# Patient Record
Sex: Male | Born: 1935 | Race: White | Hispanic: No | Smoking: Never smoker
Health system: Southern US, Community
[De-identification: ages and names within clinical notes are randomized; demographics above are authoritative.]

## PROBLEM LIST (undated history)

## (undated) DIAGNOSIS — E119 Type 2 diabetes mellitus without complications: Secondary | ICD-10-CM

## (undated) DIAGNOSIS — E079 Disorder of thyroid, unspecified: Secondary | ICD-10-CM

## (undated) DIAGNOSIS — M9712XA 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, initial encounter: Secondary | ICD-10-CM

## (undated) DIAGNOSIS — S72141A Displaced intertrochanteric fracture of right femur, initial encounter for closed fracture: Secondary | ICD-10-CM

## (undated) DIAGNOSIS — F329 Major depressive disorder, single episode, unspecified: Secondary | ICD-10-CM

## (undated) DIAGNOSIS — K449 Diaphragmatic hernia without obstruction or gangrene: Secondary | ICD-10-CM

## (undated) DIAGNOSIS — I10 Essential (primary) hypertension: Secondary | ICD-10-CM

## (undated) DIAGNOSIS — F32A Depression, unspecified: Secondary | ICD-10-CM

## (undated) HISTORY — PX: REPLACEMENT TOTAL KNEE: SUR1224

## (undated) HISTORY — PX: HERNIA REPAIR: SHX51

---

## 2015-11-19 ENCOUNTER — Encounter (HOSPITAL_COMMUNITY): Payer: Self-pay

## 2015-11-19 ENCOUNTER — Emergency Department (HOSPITAL_COMMUNITY)
Admission: EM | Admit: 2015-11-19 | Discharge: 2015-11-19 | Disposition: A | Discharge status: Home / Self Care | Payer: Medicare Other | Attending: Emergency Medicine | Admitting: Emergency Medicine

## 2015-11-19 ENCOUNTER — Emergency Department (HOSPITAL_COMMUNITY): Payer: Medicare Other

## 2015-11-19 DIAGNOSIS — R197 Diarrhea, unspecified: Secondary | ICD-10-CM | POA: Diagnosis present

## 2015-11-19 DIAGNOSIS — F329 Major depressive disorder, single episode, unspecified: Secondary | ICD-10-CM | POA: Insufficient documentation

## 2015-11-19 DIAGNOSIS — R1010 Upper abdominal pain, unspecified: Secondary | ICD-10-CM | POA: Insufficient documentation

## 2015-11-19 DIAGNOSIS — I10 Essential (primary) hypertension: Secondary | ICD-10-CM | POA: Insufficient documentation

## 2015-11-19 DIAGNOSIS — Z96659 Presence of unspecified artificial knee joint: Secondary | ICD-10-CM | POA: Diagnosis not present

## 2015-11-19 DIAGNOSIS — E119 Type 2 diabetes mellitus without complications: Secondary | ICD-10-CM | POA: Diagnosis not present

## 2015-11-19 DIAGNOSIS — Z7984 Long term (current) use of oral hypoglycemic drugs: Secondary | ICD-10-CM | POA: Diagnosis not present

## 2015-11-19 HISTORY — DX: Diaphragmatic hernia without obstruction or gangrene: K44.9

## 2015-11-19 HISTORY — DX: Essential (primary) hypertension: I10

## 2015-11-19 HISTORY — DX: Depression, unspecified: F32.A

## 2015-11-19 HISTORY — DX: Major depressive disorder, single episode, unspecified: F32.9

## 2015-11-19 HISTORY — DX: Disorder of thyroid, unspecified: E07.9

## 2015-11-19 HISTORY — DX: Type 2 diabetes mellitus without complications: E11.9

## 2015-11-19 LAB — COMPREHENSIVE METABOLIC PANEL
ALT: 40 U/L (ref 17–63)
ANION GAP: 7 (ref 5–15)
AST: 29 U/L (ref 15–41)
Albumin: 4.3 g/dL (ref 3.5–5.0)
Alkaline Phosphatase: 63 U/L (ref 38–126)
BUN: 13 mg/dL (ref 6–20)
CHLORIDE: 105 mmol/L (ref 101–111)
CO2: 26 mmol/L (ref 22–32)
Calcium: 9.9 mg/dL (ref 8.9–10.3)
Creatinine, Ser: 1.04 mg/dL (ref 0.61–1.24)
GFR calc non Af Amer: >60 mL/min (ref >60)
GLUCOSE: 90 mg/dL (ref 65–99)
POTASSIUM: 3.2 mmol/L — AB (ref 3.5–5.1)
SODIUM: 138 mmol/L (ref 135–145)
TOTAL PROTEIN: 7.4 g/dL (ref 6.5–8.1)
Total Bilirubin: 1 mg/dL (ref 0.3–1.2)

## 2015-11-19 LAB — LIPASE, BLOOD: LIPASE: 21 U/L (ref 11–51)

## 2015-11-19 LAB — CBC
HEMATOCRIT: 46.8 % (ref 39.0–52.0)
HEMOGLOBIN: 16.3 g/dL (ref 13.0–17.0)
MCH: 31.3 pg (ref 26.0–34.0)
MCHC: 34.8 g/dL (ref 30.0–36.0)
MCV: 89.8 fL (ref 78.0–100.0)
Platelets: 231 10*3/uL (ref 150–400)
RBC: 5.21 MIL/uL (ref 4.22–5.81)
RDW: 13.2 % (ref 11.5–15.5)
WBC: 8.8 10*3/uL (ref 4.0–10.5)

## 2015-11-19 LAB — URINALYSIS, ROUTINE W REFLEX MICROSCOPIC
BILIRUBIN URINE: NEGATIVE
Glucose, UA: NEGATIVE mg/dL
Hgb urine dipstick: NEGATIVE
Ketones, ur: NEGATIVE mg/dL
Leukocytes, UA: NEGATIVE
NITRITE: NEGATIVE
PH: 6.5 (ref 5.0–8.0)
Protein, ur: NEGATIVE mg/dL
SPECIFIC GRAVITY, URINE: 1.02 (ref 1.005–1.030)

## 2015-11-19 MED ORDER — IOPAMIDOL (ISOVUE-300) INJECTION 61%
100.0000 mL | Freq: Once | INTRAVENOUS | Status: AC | PRN
  Administered 2015-11-19: 100 mL via INTRAVENOUS

## 2015-11-19 MED ORDER — SODIUM CHLORIDE 0.9 % IV BOLUS (SEPSIS)
1000.0000 mL | Freq: Once | INTRAVENOUS | Status: AC
  Administered 2015-11-19: 1000 mL via INTRAVENOUS

## 2015-11-19 MED ORDER — ONDANSETRON HCL 4 MG/2ML IJ SOLN
4.0000 mg | Freq: Once | INTRAMUSCULAR | Status: AC
  Administered 2015-11-19: 4 mg via INTRAVENOUS
  Filled 2015-11-19: qty 2

## 2015-11-19 MED ORDER — POTASSIUM CHLORIDE CRYS ER 20 MEQ PO TBCR
40.0000 meq | EXTENDED_RELEASE_TABLET | Freq: Once | ORAL | Status: AC
  Administered 2015-11-19: 40 meq via ORAL
  Filled 2015-11-19: qty 2

## 2015-11-19 NOTE — Discharge Instructions (Signed)
Return here as needed.  Follow-up with your primary care doctor     Food Choices to Help Relieve Diarrhea, Adult When you have diarrhea, the foods you eat and your eating habits are very important. Choosing the right foods and drinks can help relieve diarrhea. Also, because diarrhea can last up to 7 days, you need to replace lost fluids and electrolytes (such as sodium, potassium, and chloride) in order to help prevent dehydration.  WHAT GENERAL GUIDELINES DO I NEED TO FOLLOW?  Slowly drink 1 cup (8 oz) of fluid for each episode of diarrhea. If you are getting enough fluid, your urine will be clear or pale yellow.  Eat starchy foods. Some good choices include white rice, white toast, pasta, low-fiber cereal, baked potatoes (without the skin), saltine crackers, and bagels.  Avoid large servings of any cooked vegetables.  Limit fruit to two servings per day. A serving is  cup or 1 small piece.  Choose foods with less than 2 g of fiber per serving.  Limit fats to less than 8 tsp (38 g) per day.  Avoid fried foods.  Eat foods that have probiotics in them. Probiotics can be found in certain dairy products.  Avoid foods and beverages that may increase the speed at which food moves through the stomach and intestines (gastrointestinal tract). Things to avoid include:  High-fiber foods, such as dried fruit, raw fruits and vegetables, nuts, seeds, and whole grain foods.  Spicy foods and high-fat foods.  Foods and beverages sweetened with high-fructose corn syrup, honey, or sugar alcohols such as xylitol, sorbitol, and mannitol. WHAT FOODS ARE RECOMMENDED? Grains White rice. White, [place], or pita breads (fresh or toasted), including plain rolls, buns, or bagels. White pasta. Saltine, soda, or graham crackers. Pretzels. Low-fiber cereal. Cooked cereals made with water (such as cornmeal, farina, or cream cereals). Plain muffins. Matzo. Melba toast. Zwieback.  Vegetables Potatoes (without the  skin). Strained tomato and vegetable juices. Most well-cooked and canned vegetables without seeds. Tender lettuce. Fruits Cooked or canned applesauce, apricots, cherries, fruit cocktail, grapefruit, peaches, pears, or plums. Fresh bananas, apples without skin, cherries, grapes, cantaloupe, grapefruit, peaches, oranges, or plums.  Meat and Other Protein Products Baked or boiled chicken. Eggs. Tofu. Fish. Seafood. Smooth peanut butter. Ground or well-cooked tender beef, ham, veal, lamb, pork, or poultry.  Dairy Plain yogurt, kefir, and unsweetened liquid yogurt. Lactose-free milk, buttermilk, or soy milk. Plain hard cheese. Beverages Sport drinks. Clear broths. Diluted fruit juices (except prune). Regular, caffeine-free sodas such as ginger ale. Water. Decaffeinated teas. Oral rehydration solutions. Sugar-free beverages not sweetened with sugar alcohols. Other Bouillon, broth, or soups made from recommended foods.  The items listed above may not be a complete list of recommended foods or beverages. Contact your dietitian for more options. WHAT FOODS ARE NOT RECOMMENDED? Grains Whole grain, whole wheat, bran, or rye breads, rolls, pastas, crackers, and cereals. Wild or brown rice. Cereals that contain more than 2 g of fiber per serving. Corn tortillas or taco shells. Cooked or dry oatmeal. Granola. Popcorn. Vegetables Raw vegetables. Cabbage, broccoli, Brussels sprouts, artichokes, baked beans, beet greens, corn, kale, legumes, peas, sweet potatoes, and yams. Potato skins. Cooked spinach and cabbage. Fruits Dried fruit, including raisins and dates. Raw fruits. Stewed or dried prunes. Fresh apples with skin, apricots, mangoes, pears, raspberries, and strawberries.  Meat and Other Protein Products Chunky peanut butter. Nuts and seeds. Beans and lentils. Tomasa BlaseBacon.  Dairy High-fat cheeses. Milk, chocolate milk, and beverages made with milk, such  as milk shakes. Cream. Ice cream. Sweets and  Desserts Sweet rolls, doughnuts, and sweet breads. Pancakes and waffles. Fats and Oils Butter. Cream sauces. Margarine. Salad oils. Plain salad dressings. Olives. Avocados.  Beverages Caffeinated beverages (such as coffee, tea, soda, or energy drinks). Alcoholic beverages. Fruit juices with pulp. Prune juice. Soft drinks sweetened with high-fructose corn syrup or sugar alcohols. Other Coconut. Hot sauce. Chili powder. Mayonnaise. Gravy. Cream-based or milk-based soups.  The items listed above may not be a complete list of foods and beverages to avoid. Contact your dietitian for more information. WHAT SHOULD I DO IF I BECOME DEHYDRATED? Diarrhea can sometimes lead to dehydration. Signs of dehydration include dark urine and dry mouth and skin. If you think you are dehydrated, you should rehydrate with an oral rehydration solution. These solutions can be purchased at pharmacies, retail stores, or online.  Drink -1 cup (120-240 mL) of oral rehydration solution each time you have an episode of diarrhea. If drinking this amount makes your diarrhea worse, try drinking smaller amounts more often. For example, drink 1-3 tsp (5-15 mL) every 5-10 minutes.  A general rule for staying hydrated is to drink 1-2 L of fluid per day. Talk to your health care provider about the specific amount you should be drinking each day. Drink enough fluids to keep your urine clear or pale yellow.   This information is not intended to replace advice given to you by your health care provider. Make sure you discuss any questions you have with your health care provider.   Document Released: 08/29/2003 Document Revised: 06/29/2014 Document Reviewed: 05/01/2013 Elsevier Interactive Patient Education [employer]

## 2015-11-19 NOTE — ED Notes (Signed)
Per pt, not feeling well x 1 week. Pt also states diarrhea x 4 days.  No n/v.  "lots of gas".  Pt has hiatal hernia.  Pt is a diabetic but last check per MD was wnl.  Pt also concerned for nausea with new depression meds.  No fever.

## 2015-11-19 NOTE — ED Provider Notes (Signed)
  Face-to-face evaluation   History: He has abdominal comfort today. He has had some loose stool 2-3 times a day and the last 24-36 hours. About a week ago had constipation took prunes and then MiraLAX, 1. This treatment seemed to improve that constipation. He has been recently started on Zoloft, Xanax and trazodone. [place], [place]. No history of abdominal surgery or chronic constipation. He had a fall injuring his abdomen, about 2 weeks ago.  Physical exam: Alert, elderly man in mild distress. Abdomen soft with mild upper abdominal tenderness and guarding. There is a small flat bruise of the right lateral abdominal wall.  Medical screening examination/treatment/procedure(s) were conducted as a shared visit with non-physician practitioner(s) and myself.  I personally evaluated the patient during the encounter  Mancel BaleElliott Markell Schrier, MD 11/23/15 2144

## 2015-11-20 NOTE — ED Provider Notes (Signed)
CSN: [id number]     Arrival date & time 11/19/15  1121 History   First MD Initiated Contact with Patient 11/19/15 1204     Chief Complaint  Patient presents with  . Diarrhea     (Consider location/radiation/quality/duration/timing/severity/associated sxs/prior Treatment) HPI Patient presents to the emergency department with diarrhea for the last 5 days.  The patient states that he was having constipation prior to this and states that he took MiraLAX and then the diarrhea started.  Patient states that he has not noticed any blood in the stool.  He states that this morning he did have a more formed stool, but was still not normal.  Patient states nothing seems to make the condition better or worse.  Patient has some mild upper abdominal pain. The patient denies chest pain, shortness of breath, headache,blurred vision, neck pain, fever, cough, weakness, numbness, dizziness, anorexia, edema, nausea, vomiting, rash, back pain, dysuria, hematemesis, bloody stool, near syncope, or syncope. Past Medical History  Diagnosis Date  . Diabetes mellitus without complication (HCC)   . Hiatal hernia   . Hypertension   . Thyroid disease   . Depression    Past Surgical History  Procedure Laterality Date  . Replacement total knee    . Hernia repair     History reviewed. No pertinent family history. Social History  Substance Use Topics  . Smoking status: Never Smoker   . Smokeless tobacco: None  . Alcohol Use: No    Review of Systems  All other systems negative except as documented in the HPI. All pertinent positives and negatives as reviewed in the HPI.   Allergies  Review of patient's allergies indicates no known allergies.  Home Medications   Prior to Admission medications   Medication Sig Start Date End Date Taking? Authorizing Provider  ALPRAZolam Prudy Feeler) 0.5 MG tablet Take 0.5 mg by mouth daily as needed for anxiety.   Yes Historical Provider, MD  atorvastatin (LIPITOR) 10 MG tablet  Take 10 mg by mouth daily.   Yes Historical Provider, MD  glimepiride (AMARYL) 2 MG tablet Take 2 mg by mouth daily with breakfast.   Yes Historical Provider, MD  levothyroxine (SYNTHROID, LEVOTHROID) 125 MCG tablet Take 125 mcg by mouth daily before breakfast.   Yes Historical Provider, MD  lisinopril-hydrochlorothiazide (PRINZIDE,ZESTORETIC) 20-25 MG tablet Take 1 tablet by mouth daily.   Yes Historical Provider, MD  polyethylene glycol (MIRALAX / GLYCOLAX) packet Take 17 g by mouth daily as needed for mild constipation.   Yes Historical Provider, MD  sertraline (ZOLOFT) 50 MG tablet Take 50 mg by mouth daily.   Yes Historical Provider, MD  traZODone (DESYREL) 50 MG tablet Take 50 mg by mouth at bedtime.   Yes Historical Provider, MD   BP 105/70 mmHg  Pulse 60  Temp(Src) 98.1 F (36.7 C) (Oral)  Resp 18  SpO2 100% Physical Exam  Constitutional: He is oriented to person, place, and time. He appears well-developed and well-nourished. No distress.  HENT:  Head: Normocephalic and atraumatic.  Mouth/Throat: Oropharynx is clear and moist.  Eyes: Pupils are equal, round, and reactive to light.  Neck: Normal range of motion. Neck supple.  Cardiovascular: Normal rate, regular rhythm and normal heart sounds.  Exam reveals no gallop and no friction rub.   No murmur heard. Pulmonary/Chest: Effort normal and breath sounds normal. No respiratory distress. He has no wheezes.  Abdominal: Soft. Bowel sounds are normal. He exhibits no distension. There is tenderness. There is no rebound and no  guarding.  Neurological: He is alert and oriented to person, place, and time. He exhibits normal muscle tone. Coordination normal.  Skin: Skin is warm and dry. No rash noted. No erythema.  Psychiatric: He has a normal mood and affect. His behavior is normal.  Nursing note and vitals reviewed.   ED Course  Procedures (including critical care time) Labs Review Labs Reviewed  COMPREHENSIVE METABOLIC PANEL -  Abnormal; Notable for the following:    Potassium 3.2 (*)    All other components within normal limits  LIPASE, BLOOD  CBC  URINALYSIS, ROUTINE W REFLEX MICROSCOPIC (NOT AT [facility])    Imaging Review Ct Abdomen Pelvis W Contrast  11/19/2015  CLINICAL DATA:  82 year old with 4 day history of unexplained diarrhea. Surgical history includes unspecified hernia repair. EXAM: CT ABDOMEN AND PELVIS WITH CONTRAST TECHNIQUE: Multidetector CT imaging of the abdomen and pelvis was performed using the standard protocol following bolus administration of intravenous contrast. CONTRAST:  ISOVUE-300 IOPAMIDOL INJECTION 61% IV. Oral contrast was not administered at the request of the ordering physician. COMPARISON:  None. FINDINGS: Lower chest: Mild dependent atelectasis in the lower lobes, as expected. Densely calcified approximate 1 cm granuloma deep in the medial right lower lobe. Noncalcified subpleural nodule measuring approximately 1.3 x 0.9 cm along the right hemidiaphragm. Heart mildly enlarged. Right coronary atherosclerosis. Small hiatal hernia containing intra-abdominal fat. Hepatobiliary: Diffuse hepatic steatosis without focal hepatic parenchymal abnormality Gallbladder normal in appearance without calcified gallstones. No biliary ductal dilation. Pancreas: Normal in appearance without evidence of mass, ductal dilation, or inflammation. Spleen: Calcified granulomata within the otherwise normal-appearing spleen. Adrenals/Urinary Tract: Normal appearing adrenal glands. Approximate 2.3 x 2.2 x 2.8 cm simple cyst arising from the mid right kidney. subcentimeter simple cyst arising from the lower pole right kidney. No significant focal parenchymal abnormality involving either kidney. No urinary tract calculi or hydronephrosis on either side. Normal-appearing urinary bladder. Stomach/Bowel: Stomach decompressed and normal in appearance. Small bowel normal in caliber throughout. Moderate colonic stool burden.  Solitary diverticulum arising from the proximal sigmoid colon without evidence of acute diverticulitis. Remainder of the colon normal in appearance. Normal-appearing long appendix in the right upper pelvis extending to the midline. Vascular/Lymphatic: Severe aorto-iliofemoral atherosclerosis with a focal right common iliac artery aneurysm measuring up to approximately 1.9 cm diameter. No evidence of abdominal aortic aneurysm. Visceral arteries atherosclerotic though patent. Normal-appearing portal venous and systemic venous systems. No pathologic lymphadenopathy. Reproductive: Mild median lobe prostate gland enlargement. Normal seminal vesicles. Other: Numerous pelvic phleboliths. 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 Facet degenerative changes involving the lower lumbar spine. Degenerative disc disease at L4-5 and L5-S1. Degenerative changes involving the sacroiliac joints and hips. No acute abnormalities. IMPRESSION: 1. No acute abnormalities involving the abdomen or pelvis. 2. Solitary sigmoid colon diverticula without evidence of acute diverticulitis. 3. 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 No evidence of abdominal aortic aneurysm. 4. Diffuse hepatic steatosis without focal hepatic parenchymal abnormality. 5. Old granulomatous disease. 6. Approximate 1 cm noncalcified subpleural nodule adjacent to the right hemidiaphragm in the right lower lobe, statistically likely a subpleural lymph node. Electronically Signed   By: Hulan Saas M.D.   On: 11/19/2015 14:02   I have personally reviewed and evaluated these images and lab results as part of my medical decision-making.   EKG Interpretation None      MDM   Final diagnoses:  Diarrhea, unspecified type  The patient has a negative CT scan.  Patient will be referred back to his primary care Dr.  The patient most likely had the  [facility]  Patient states he had another bowel movement here in the emergency department that was more formed than previous.  Patient is advised to return here as needed.  Patient agrees the plan and all questions were answered    Charlestine NightChristopher Antha Niday, PA-C 11/23/15 1512  Mancel BaleElliott Wentz, MD 11/23/15 2144

## 2018-01-14 ENCOUNTER — Inpatient Hospital Stay (HOSPITAL_COMMUNITY): Payer: No Typology Code available for payment source

## 2018-01-14 ENCOUNTER — Encounter (HOSPITAL_COMMUNITY): Payer: Self-pay | Admitting: Radiology

## 2018-01-14 ENCOUNTER — Other Ambulatory Visit: Payer: Self-pay

## 2018-01-14 ENCOUNTER — Inpatient Hospital Stay (HOSPITAL_COMMUNITY): Payer: No Typology Code available for payment source | Admitting: Certified Registered"

## 2018-01-14 ENCOUNTER — Emergency Department (HOSPITAL_COMMUNITY): Payer: No Typology Code available for payment source

## 2018-01-14 ENCOUNTER — Inpatient Hospital Stay (HOSPITAL_COMMUNITY)
Admission: EM | Admit: 2018-01-14 | Discharge: 2018-01-20 | DRG: 963 | Disposition: E | Payer: No Typology Code available for payment source | Attending: General Surgery | Admitting: General Surgery

## 2018-01-14 ENCOUNTER — Encounter (HOSPITAL_COMMUNITY): Admission: EM | Disposition: E | Payer: Self-pay | Source: Home / Self Care

## 2018-01-14 DIAGNOSIS — S2243XA Multiple fractures of ribs, bilateral, initial encounter for closed fracture: Secondary | ICD-10-CM | POA: Diagnosis present

## 2018-01-14 DIAGNOSIS — S72141A Displaced intertrochanteric fracture of right femur, initial encounter for closed fracture: Secondary | ICD-10-CM | POA: Diagnosis present

## 2018-01-14 DIAGNOSIS — R34 Anuria and oliguria: Secondary | ICD-10-CM | POA: Diagnosis present

## 2018-01-14 DIAGNOSIS — T508X5A Adverse effect of diagnostic agents, initial encounter: Secondary | ICD-10-CM | POA: Diagnosis present

## 2018-01-14 DIAGNOSIS — J9602 Acute respiratory failure with hypercapnia: Secondary | ICD-10-CM | POA: Diagnosis not present

## 2018-01-14 DIAGNOSIS — Z9689 Presence of other specified functional implants: Secondary | ICD-10-CM

## 2018-01-14 DIAGNOSIS — D649 Anemia, unspecified: Secondary | ICD-10-CM | POA: Diagnosis not present

## 2018-01-14 DIAGNOSIS — M9712XA 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, initial encounter: Secondary | ICD-10-CM | POA: Diagnosis present

## 2018-01-14 DIAGNOSIS — Z96652 Presence of left artificial knee joint: Secondary | ICD-10-CM | POA: Diagnosis present

## 2018-01-14 DIAGNOSIS — R579 Shock, unspecified: Secondary | ICD-10-CM | POA: Diagnosis present

## 2018-01-14 DIAGNOSIS — Z515 Encounter for palliative care: Secondary | ICD-10-CM | POA: Diagnosis present

## 2018-01-14 DIAGNOSIS — N280 Ischemia and infarction of kidney: Secondary | ICD-10-CM | POA: Diagnosis present

## 2018-01-14 DIAGNOSIS — S060X9A Concussion with loss of consciousness of unspecified duration, initial encounter: Secondary | ICD-10-CM | POA: Diagnosis present

## 2018-01-14 DIAGNOSIS — I480 Paroxysmal atrial fibrillation: Secondary | ICD-10-CM

## 2018-01-14 DIAGNOSIS — S270XXA Traumatic pneumothorax, initial encounter: Secondary | ICD-10-CM | POA: Diagnosis present

## 2018-01-14 DIAGNOSIS — Z781 Physical restraint status: Secondary | ICD-10-CM

## 2018-01-14 DIAGNOSIS — Y92413 State road as the place of occurrence of the external cause: Secondary | ICD-10-CM

## 2018-01-14 DIAGNOSIS — T148XXA Other injury of unspecified body region, initial encounter: Secondary | ICD-10-CM

## 2018-01-14 DIAGNOSIS — D696 Thrombocytopenia, unspecified: Secondary | ICD-10-CM | POA: Diagnosis present

## 2018-01-14 DIAGNOSIS — S81012A Laceration without foreign body, left knee, initial encounter: Secondary | ICD-10-CM

## 2018-01-14 DIAGNOSIS — S2249XA Multiple fractures of ribs, unspecified side, initial encounter for closed fracture: Secondary | ICD-10-CM

## 2018-01-14 DIAGNOSIS — T17918A Gastric contents in respiratory tract, part unspecified causing other injury, initial encounter: Secondary | ICD-10-CM | POA: Diagnosis not present

## 2018-01-14 DIAGNOSIS — S24103A Unspecified injury at T7-T10 level of thoracic spinal cord, initial encounter: Secondary | ICD-10-CM | POA: Diagnosis present

## 2018-01-14 DIAGNOSIS — J9601 Acute respiratory failure with hypoxia: Secondary | ICD-10-CM | POA: Diagnosis not present

## 2018-01-14 DIAGNOSIS — D62 Acute posthemorrhagic anemia: Secondary | ICD-10-CM | POA: Diagnosis present

## 2018-01-14 DIAGNOSIS — R402253 Coma scale, best verbal response, oriented, at hospital admission: Secondary | ICD-10-CM | POA: Diagnosis present

## 2018-01-14 DIAGNOSIS — Z978 Presence of other specified devices: Secondary | ICD-10-CM

## 2018-01-14 DIAGNOSIS — E875 Hyperkalemia: Secondary | ICD-10-CM | POA: Diagnosis not present

## 2018-01-14 DIAGNOSIS — Z01818 Encounter for other preprocedural examination: Secondary | ICD-10-CM

## 2018-01-14 DIAGNOSIS — S72402A Unspecified fracture of lower end of left femur, initial encounter for closed fracture: Secondary | ICD-10-CM

## 2018-01-14 DIAGNOSIS — S22078A Other fracture of T9-T10 vertebra, initial encounter for closed fracture: Secondary | ICD-10-CM | POA: Diagnosis present

## 2018-01-14 DIAGNOSIS — R402143 Coma scale, eyes open, spontaneous, at hospital admission: Secondary | ICD-10-CM | POA: Diagnosis present

## 2018-01-14 DIAGNOSIS — R402363 Coma scale, best motor response, obeys commands, at hospital admission: Secondary | ICD-10-CM | POA: Diagnosis present

## 2018-01-14 DIAGNOSIS — I6340 Cerebral infarction due to embolism of unspecified cerebral artery: Secondary | ICD-10-CM

## 2018-01-14 DIAGNOSIS — N179 Acute kidney failure, unspecified: Secondary | ICD-10-CM | POA: Diagnosis not present

## 2018-01-14 DIAGNOSIS — I10 Essential (primary) hypertension: Secondary | ICD-10-CM | POA: Diagnosis present

## 2018-01-14 DIAGNOSIS — Z8249 Family history of ischemic heart disease and other diseases of the circulatory system: Secondary | ICD-10-CM

## 2018-01-14 DIAGNOSIS — E785 Hyperlipidemia, unspecified: Secondary | ICD-10-CM | POA: Diagnosis present

## 2018-01-14 DIAGNOSIS — E039 Hypothyroidism, unspecified: Secondary | ICD-10-CM | POA: Diagnosis present

## 2018-01-14 DIAGNOSIS — S2242XA Multiple fractures of ribs, left side, initial encounter for closed fracture: Secondary | ICD-10-CM

## 2018-01-14 DIAGNOSIS — I4891 Unspecified atrial fibrillation: Secondary | ICD-10-CM | POA: Diagnosis not present

## 2018-01-14 DIAGNOSIS — N170 Acute kidney failure with tubular necrosis: Secondary | ICD-10-CM | POA: Diagnosis present

## 2018-01-14 DIAGNOSIS — J939 Pneumothorax, unspecified: Secondary | ICD-10-CM

## 2018-01-14 DIAGNOSIS — Z79899 Other long term (current) drug therapy: Secondary | ICD-10-CM

## 2018-01-14 DIAGNOSIS — R0902 Hypoxemia: Secondary | ICD-10-CM

## 2018-01-14 DIAGNOSIS — E1165 Type 2 diabetes mellitus with hyperglycemia: Secondary | ICD-10-CM | POA: Diagnosis present

## 2018-01-14 DIAGNOSIS — I63423 Cerebral infarction due to embolism of bilateral anterior cerebral arteries: Secondary | ICD-10-CM | POA: Diagnosis not present

## 2018-01-14 DIAGNOSIS — Z452 Encounter for adjustment and management of vascular access device: Secondary | ICD-10-CM

## 2018-01-14 DIAGNOSIS — T85598A Other mechanical complication of other gastrointestinal prosthetic devices, implants and grafts, initial encounter: Secondary | ICD-10-CM

## 2018-01-14 DIAGNOSIS — S22009A Unspecified fracture of unspecified thoracic vertebra, initial encounter for closed fracture: Secondary | ICD-10-CM

## 2018-01-14 DIAGNOSIS — Z79890 Hormone replacement therapy (postmenopausal): Secondary | ICD-10-CM

## 2018-01-14 DIAGNOSIS — Z7984 Long term (current) use of oral hypoglycemic drugs: Secondary | ICD-10-CM

## 2018-01-14 DIAGNOSIS — S72001A Fracture of unspecified part of neck of right femur, initial encounter for closed fracture: Secondary | ICD-10-CM

## 2018-01-14 DIAGNOSIS — S22068A Other fracture of T7-T8 thoracic vertebra, initial encounter for closed fracture: Secondary | ICD-10-CM | POA: Diagnosis present

## 2018-01-14 HISTORY — DX: Displaced intertrochanteric fracture of right femur, initial encounter for closed fracture: S72.141A

## 2018-01-14 HISTORY — DX: 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, initial encounter: M97.12XA

## 2018-01-14 LAB — PREPARE FRESH FROZEN PLASMA
Unit division: 0
Unit division: 0

## 2018-01-14 LAB — CBC
HEMATOCRIT: 45.3 % (ref 39.0–52.0)
HEMATOCRIT: 35.4 % — AB (ref 39.0–52.0)
HEMOGLOBIN: 14.2 g/dL (ref 13.0–17.0)
HEMOGLOBIN: 11.3 g/dL — AB (ref 13.0–17.0)
MCH: 29.7 pg (ref 26.0–34.0)
MCH: 29.9 pg (ref 26.0–34.0)
MCHC: 31.3 g/dL (ref 30.0–36.0)
MCHC: 31.9 g/dL (ref 30.0–36.0)
MCV: 94.8 fL (ref 78.0–100.0)
MCV: 93.7 fL (ref 78.0–100.0)
Platelets: 239 10*3/uL (ref 150–400)
Platelets: 143 10*3/uL — ABNORMAL LOW (ref 150–400)
RBC: 4.78 MIL/uL (ref 4.22–5.81)
RBC: 3.78 MIL/uL — ABNORMAL LOW (ref 4.22–5.81)
RDW: 13.6 % (ref 11.5–15.5)
RDW: 14.1 % (ref 11.5–15.5)
WBC: 12 10*3/uL — AB (ref 4.0–10.5)
WBC: 15.6 10*3/uL — ABNORMAL HIGH (ref 4.0–10.5)

## 2018-01-14 LAB — URINALYSIS, ROUTINE W REFLEX MICROSCOPIC
Bilirubin Urine: NEGATIVE
Glucose, UA: NEGATIVE mg/dL
Ketones, ur: NEGATIVE mg/dL
Leukocytes, UA: NEGATIVE
NITRITE: NEGATIVE
PROTEIN: NEGATIVE mg/dL
Specific Gravity, Urine: 1.041 — ABNORMAL HIGH (ref 1.005–1.030)
pH: 6 (ref 5.0–8.0)

## 2018-01-14 LAB — BPAM FFP
Blood Product Expiration Date: 201908102359
Blood Product Expiration Date: 201908162359
ISSUE DATE / TIME: 201907261501
ISSUE DATE / TIME: 201907261501
UNIT TYPE AND RH: 600
Unit Type and Rh: 6200

## 2018-01-14 LAB — CDS SEROLOGY

## 2018-01-14 LAB — I-STAT CHEM 8, ED
BUN: 21 mg/dL (ref 8–23)
CREATININE: 1.1 mg/dL (ref 0.61–1.24)
Calcium, Ion: 1.12 mmol/L — ABNORMAL LOW (ref 1.15–1.40)
Chloride: 109 mmol/L (ref 98–111)
Glucose, Bld: 164 mg/dL — ABNORMAL HIGH (ref 70–99)
HCT: 42 % (ref 39.0–52.0)
Hemoglobin: 14.3 g/dL (ref 13.0–17.0)
Potassium: 3.7 mmol/L (ref 3.5–5.1)
SODIUM: 141 mmol/L (ref 135–145)
TCO2: 20 mmol/L — AB (ref 22–32)

## 2018-01-14 LAB — ABO/RH: ABO/RH(D): O NEG

## 2018-01-14 LAB — COMPREHENSIVE METABOLIC PANEL
ALBUMIN: 3.3 g/dL — AB (ref 3.5–5.0)
ALT: 72 U/L — ABNORMAL HIGH (ref 0–44)
AST: 98 U/L — ABNORMAL HIGH (ref 15–41)
Alkaline Phosphatase: 72 U/L (ref 38–126)
Anion gap: 10 (ref 5–15)
BILIRUBIN TOTAL: 1 mg/dL (ref 0.3–1.2)
BUN: 18 mg/dL (ref 8–23)
CALCIUM: 8.9 mg/dL (ref 8.9–10.3)
CO2: 20 mmol/L — ABNORMAL LOW (ref 22–32)
Chloride: 111 mmol/L (ref 98–111)
Creatinine, Ser: 1.27 mg/dL — ABNORMAL HIGH (ref 0.61–1.24)
GFR, EST AFRICAN AMERICAN: 59 mL/min — AB (ref >60)
GFR, EST NON AFRICAN AMERICAN: 51 mL/min — AB (ref >60)
Glucose, Bld: 169 mg/dL — ABNORMAL HIGH (ref 70–99)
POTASSIUM: 3.7 mmol/L (ref 3.5–5.1)
Sodium: 141 mmol/L (ref 135–145)
TOTAL PROTEIN: 6 g/dL — AB (ref 6.5–8.1)

## 2018-01-14 LAB — LACTIC ACID, PLASMA
LACTIC ACID, VENOUS: 5.2 mmol/L — AB (ref 0.5–1.9)
Lactic Acid, Venous: 4 mmol/L (ref 0.5–1.9)

## 2018-01-14 LAB — PROTIME-INR
INR: 1.36
Prothrombin Time: 16.6 seconds — ABNORMAL HIGH (ref 11.4–15.2)

## 2018-01-14 LAB — I-STAT CG4 LACTIC ACID, ED: Lactic Acid, Venous: 4.94 mmol/L (ref 0.5–1.9)

## 2018-01-14 LAB — APTT: APTT: 35 s (ref 24–36)

## 2018-01-14 LAB — MRSA PCR SCREENING: MRSA by PCR: NEGATIVE

## 2018-01-14 LAB — PREPARE RBC (CROSSMATCH)

## 2018-01-14 LAB — ETHANOL

## 2018-01-14 SURGERY — IRRIGATION AND DEBRIDEMENT EXTREMITY
Anesthesia: Choice | Laterality: Right

## 2018-01-14 MED ORDER — DOCUSATE SODIUM 50 MG/5ML PO LIQD: 100.0000 mg | Freq: Two times a day (BID) | ORAL | Status: DC | PRN

## 2018-01-14 MED ORDER — FENTANYL CITRATE (PF) 100 MCG/2ML IJ SOLN
INTRAMUSCULAR | Status: AC | PRN
  Administered 2018-01-14 (×2): 50 ug via INTRAVENOUS

## 2018-01-14 MED ORDER — SODIUM CHLORIDE 0.9 % IV SOLN: INTRAVENOUS | Status: DC | PRN

## 2018-01-14 MED ORDER — ACETAMINOPHEN 500 MG PO TABS
1000.0000 mg | ORAL_TABLET | Freq: Three times a day (TID) | ORAL | Status: DC
  Administered 2018-01-16 – 2018-01-18 (×4): 1000 mg via ORAL
  Filled 2018-01-14 (×5): qty 2

## 2018-01-14 MED ORDER — PANTOPRAZOLE SODIUM 40 MG PO TBEC: 40.0000 mg | DELAYED_RELEASE_TABLET | ORAL | Status: DC

## 2018-01-14 MED ORDER — ONDANSETRON HCL 4 MG/2ML IJ SOLN
INTRAMUSCULAR | Status: AC
  Filled 2018-01-14: qty 2

## 2018-01-14 MED ORDER — SODIUM CHLORIDE 0.9% IV SOLUTION: Freq: Once | INTRAVENOUS | Status: DC

## 2018-01-14 MED ORDER — PHENYLEPHRINE HCL-NACL 10-0.9 MG/250ML-% IV SOLN
0.0000 ug/min | INTRAVENOUS | Status: DC
  Administered 2018-01-14: 30 ug/min via INTRAVENOUS
  Administered 2018-01-14 (×4): 300 ug/min via INTRAVENOUS
  Filled 2018-01-14 (×5): qty 250

## 2018-01-14 MED ORDER — ACETAMINOPHEN 10 MG/ML IV SOLN
1000.0000 mg | Freq: Four times a day (QID) | INTRAVENOUS | Status: AC
  Administered 2018-01-15 (×4): 1000 mg via INTRAVENOUS
  Filled 2018-01-14 (×4): qty 100

## 2018-01-14 MED ORDER — SODIUM CHLORIDE 0.9 % IV BOLUS
500.0000 mL | Freq: Once | INTRAVENOUS | Status: AC
  Administered 2018-01-14: 500 mL via INTRAVENOUS

## 2018-01-14 MED ORDER — PIPERACILLIN-TAZOBACTAM 3.375 G IVPB
3.3750 g | Freq: Three times a day (TID) | INTRAVENOUS | Status: DC
  Administered 2018-01-14 – 2018-01-16 (×5): 3.375 g via INTRAVENOUS
  Filled 2018-01-14 (×8): qty 50

## 2018-01-14 MED ORDER — BISACODYL 10 MG RE SUPP: 10.0000 mg | Freq: Every day | RECTAL | Status: DC | PRN

## 2018-01-14 MED ORDER — TETANUS-DIPHTH-ACELL PERTUSSIS 5-2.5-18.5 LF-MCG/0.5 IM SUSP
0.5000 mL | Freq: Once | INTRAMUSCULAR | Status: AC
  Administered 2018-01-14: 0.5 mL via INTRAMUSCULAR
  Filled 2018-01-14: qty 0.5

## 2018-01-14 MED ORDER — FENTANYL CITRATE (PF) 100 MCG/2ML IJ SOLN
50.0000 ug | INTRAMUSCULAR | Status: DC | PRN
  Administered 2018-01-14 (×2): 50 ug via INTRAVENOUS
  Filled 2018-01-14 (×2): qty 2

## 2018-01-14 MED ORDER — FAMOTIDINE IN NACL 20-0.9 MG/50ML-% IV SOLN
20.0000 mg | INTRAVENOUS | Status: DC
  Administered 2018-01-15 – 2018-01-17 (×3): 20 mg via INTRAVENOUS
  Filled 2018-01-14 (×3): qty 50

## 2018-01-14 MED ORDER — TAMSULOSIN HCL 0.4 MG PO CAPS
0.4000 mg | ORAL_CAPSULE | Freq: Every day | ORAL | Status: DC
  Filled 2018-01-14: qty 1

## 2018-01-14 MED ORDER — TRAMADOL HCL 50 MG PO TABS: 50.0000 mg | ORAL_TABLET | Freq: Four times a day (QID) | ORAL | Status: DC | PRN

## 2018-01-14 MED ORDER — ORAL CARE MOUTH RINSE
15.0000 mL | OROMUCOSAL | Status: DC
  Administered 2018-01-14 – 2018-01-18 (×37): 15 mL via OROMUCOSAL

## 2018-01-14 MED ORDER — SUCCINYLCHOLINE CHLORIDE 20 MG/ML IJ SOLN
INTRAMUSCULAR | Status: DC | PRN
  Administered 2018-01-14: 140 mg via INTRAVENOUS

## 2018-01-14 MED ORDER — CEFAZOLIN SODIUM-DEXTROSE 2-4 GM/100ML-% IV SOLN
INTRAVENOUS | Status: AC
Start: 2018-01-14 — End: 2018-01-15
  Filled 2018-01-14: qty 100

## 2018-01-14 MED ORDER — ONDANSETRON HCL 4 MG/2ML IJ SOLN
INTRAMUSCULAR | Status: AC | PRN
  Administered 2018-01-14 (×2): 4 mg via INTRAVENOUS

## 2018-01-14 MED ORDER — DOCUSATE SODIUM 100 MG PO CAPS: 100.0000 mg | ORAL_CAPSULE | Freq: Two times a day (BID) | ORAL | Status: DC

## 2018-01-14 MED ORDER — SODIUM CHLORIDE 0.9 % IV SOLN
INTRAVENOUS | Status: DC
  Administered 2018-01-14 – 2018-01-15 (×6): via INTRAVENOUS

## 2018-01-14 MED ORDER — ONDANSETRON HCL 4 MG/2ML IJ SOLN
4.0000 mg | Freq: Four times a day (QID) | INTRAMUSCULAR | Status: DC | PRN
  Administered 2018-01-14 – 2018-01-17 (×2): 4 mg via INTRAVENOUS
  Filled 2018-01-14 (×2): qty 2

## 2018-01-14 MED ORDER — CEFAZOLIN SODIUM-DEXTROSE 2-4 GM/100ML-% IV SOLN
2.0000 g | Freq: Three times a day (TID) | INTRAVENOUS | Status: DC
  Filled 2018-01-14 (×2): qty 100

## 2018-01-14 MED ORDER — MORPHINE SULFATE (PF) 4 MG/ML IV SOLN: 2.0000 mg | INTRAVENOUS | Status: DC | PRN

## 2018-01-14 MED ORDER — CHLORHEXIDINE GLUCONATE 0.12% ORAL RINSE (MEDLINE KIT)
15.0000 mL | Freq: Two times a day (BID) | OROMUCOSAL | Status: DC
  Administered 2018-01-14 – 2018-01-18 (×8): 15 mL via OROMUCOSAL

## 2018-01-14 MED ORDER — FENTANYL CITRATE (PF) 100 MCG/2ML IJ SOLN
INTRAMUSCULAR | Status: AC
  Filled 2018-01-14: qty 2

## 2018-01-14 MED ORDER — FENTANYL CITRATE (PF) 100 MCG/2ML IJ SOLN: 50.0000 ug | INTRAMUSCULAR | Status: DC | PRN

## 2018-01-14 MED ORDER — OXYCODONE HCL 5 MG PO TABS: 5.0000 mg | ORAL_TABLET | ORAL | Status: DC | PRN

## 2018-01-14 MED ORDER — METOPROLOL TARTRATE 5 MG/5ML IV SOLN: 5.0000 mg | Freq: Four times a day (QID) | INTRAVENOUS | Status: DC | PRN

## 2018-01-14 MED ORDER — ONDANSETRON 4 MG PO TBDP: 4.0000 mg | ORAL_TABLET | Freq: Four times a day (QID) | ORAL | Status: DC | PRN

## 2018-01-14 MED ORDER — SODIUM CHLORIDE 0.9 % IV SOLN
INTRAVENOUS | Status: AC | PRN
  Administered 2018-01-14 (×3): 1000 mL via INTRAVENOUS

## 2018-01-14 MED ORDER — CEFAZOLIN SODIUM-DEXTROSE 1-4 GM/50ML-% IV SOLN
INTRAVENOUS | Status: AC | PRN
  Administered 2018-01-14: 2 g via INTRAVENOUS

## 2018-01-14 MED ORDER — LEVOTHYROXINE SODIUM 150 MCG PO TABS
150.0000 ug | ORAL_TABLET | Freq: Every day | ORAL | Status: DC
  Administered 2018-01-17: 150 ug via ORAL
  Filled 2018-01-14 (×3): qty 1
  Filled 2018-01-14: qty 2

## 2018-01-14 MED ORDER — PROPOFOL 10 MG/ML IV BOLUS
INTRAVENOUS | Status: DC | PRN
  Administered 2018-01-14: 50 mg via INTRAVENOUS

## 2018-01-14 MED ORDER — PROMETHAZINE HCL 25 MG/ML IJ SOLN
12.5000 mg | Freq: Once | INTRAMUSCULAR | Status: AC
  Administered 2018-01-14: 12.5 mg via INTRAVENOUS
  Filled 2018-01-14: qty 1

## 2018-01-14 MED ORDER — IOHEXOL 300 MG/ML  SOLN
100.0000 mL | Freq: Once | INTRAMUSCULAR | Status: AC | PRN
  Administered 2018-01-14: 100 mL via INTRAVENOUS

## 2018-01-14 MED ORDER — FENTANYL 2500MCG IN NS 250ML (10MCG/ML) PREMIX INFUSION
0.0000 ug/h | INTRAVENOUS | Status: DC
Start: 2018-01-14 — End: 2018-01-18
  Administered 2018-01-15: 200 ug/h via INTRAVENOUS
  Administered 2018-01-15: 250 ug/h via INTRAVENOUS
  Administered 2018-01-15: 25 ug/h via INTRAVENOUS
  Administered 2018-01-16: 200 ug/h via INTRAVENOUS
  Administered 2018-01-16: 75 ug/h via INTRAVENOUS
  Administered 2018-01-17: 150 ug/h via INTRAVENOUS
  Filled 2018-01-14 (×5): qty 250

## 2018-01-14 MED ORDER — NOREPINEPHRINE 4 MG/250ML-% IV SOLN
0.0000 ug/min | INTRAVENOUS | Status: DC
  Administered 2018-01-14: 30 ug/min via INTRAVENOUS
  Administered 2018-01-15: 38 ug/min via INTRAVENOUS
  Administered 2018-01-15: 30 ug/min via INTRAVENOUS
  Administered 2018-01-15: 38 ug/min via INTRAVENOUS
  Administered 2018-01-15: 5.333 ug/min via INTRAVENOUS
  Administered 2018-01-15: 38 ug/min via INTRAVENOUS
  Administered 2018-01-15: 5.333 ug/min via INTRAVENOUS
  Filled 2018-01-14 (×7): qty 250

## 2018-01-14 MED ORDER — MIDAZOLAM HCL 2 MG/2ML IJ SOLN
1.0000 mg | INTRAMUSCULAR | Status: DC | PRN
  Administered 2018-01-14 (×2): 1 mg via INTRAVENOUS
  Filled 2018-01-14 (×3): qty 2

## 2018-01-14 MED ORDER — FENTANYL CITRATE (PF) 250 MCG/5ML IJ SOLN
INTRAMUSCULAR | Status: AC
  Filled 2018-01-14: qty 5

## 2018-01-14 MED ORDER — PROPOFOL 10 MG/ML IV BOLUS
INTRAVENOUS | Status: AC
  Filled 2018-01-14: qty 20

## 2018-01-14 MED ORDER — MIDAZOLAM HCL 2 MG/2ML IJ SOLN: 1.0000 mg | INTRAMUSCULAR | Status: DC | PRN

## 2018-01-14 NOTE — ED Notes (Signed)
Pt vomiting thick green emesis. Tilted up to protect airway and being suctioned, airway intact.
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
## 2018-01-14 NOTE — ED Notes (Addendum)
BP 83/68, pt is lethargic....MD and PA notified, ordered to rapidly tranfuse 1 unit of rbc's o-neg, and 1 unit of FFP. Blood bank notified, emt running down to get blood.

## 2018-01-14 NOTE — Progress Notes (Signed)
Patient ID: Noah HemanCharles E Adams, male   DOB: [birth year], 84 y.o.   MRN: [id number]  Shortly after arrival to ICU, patient vomited.  A brief time later, he became unresponsive, hypertensive, increased work of breathing.  Anesthesia successfully intubated the patient, possible vomitus in airway.  Will start empiric abx.  BP labile - will recheck hgb, start Neo.  Intermittent Versed, Fentanyl for sedation/ analgesia.  Arterial line.  Soft wrist restraints.  Discussed with family.  Wilmon ArmsMatthew K. Corliss Skainssuei, MD, [facility]  General/ Trauma Surgery  November 26, 2017 7:11 PM
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
## 2018-01-14 NOTE — Progress Notes (Signed)
Pt hypotensive, bp 87/57. RN notified MD and gave verbal order togive NS bolus stat. RN will begin to administer and re assess BP closely.
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
## 2018-01-14 NOTE — ED Notes (Signed)
2nd Unit of FFP complete. BP is stable in the 120s systolic, pt axox4. Pt still on NRB at 12L. Tolerating well. Family at bedside. Unclear when pt will go to surgery. Dr Lindie SpruceWyatt ordered for the patient to go to ICU now.
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
## 2018-01-14 NOTE — ED Notes (Signed)
1 unit of FFP started at bolus rate.
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
## 2018-01-14 NOTE — ED Notes (Signed)
2nd unit of FFP started

## 2018-01-14 NOTE — ED Notes (Signed)
1 unit RBC's o-neg complete

## 2018-01-14 NOTE — ED Notes (Signed)
1 unit of FFP completed

## 2018-01-14 NOTE — ED Notes (Signed)
Old aspen collar soiled in vomit, new aspen collar placed by this RN. Pt stable and nausea improved.

## 2018-01-14 NOTE — ED Notes (Signed)
2nd unit of O-neg RBC's complete

## 2018-01-14 NOTE — ED Notes (Signed)
o-neg blood transfusion started at bolus rate per Dr Lindie SpruceWyatt
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
## 2018-01-14 NOTE — Progress Notes (Signed)
[facility] [place].  Daughter concerned about dog and small duffle bag that was in the car.  Husband spoke to [employer] about these concern.  Emotional and Spiritual support provided.  Daughter at bedside holding her father's hand.  Please page Chaplain should additional support be needed.  We will continue to follow up. Thank you to the medical team for caring for this patient and family.    01/19/2018 1654  Clinical Encounter Type  Visited With Patient;Family;Health care provider  Visit Type Follow-up;Spiritual support;ED;Trauma

## 2018-01-14 NOTE — ED Notes (Signed)
Pt vomiting green emesis again. Given zofran, hob tilted up for airway support, airway intact, mouth suctioned. Verbal order for 12.5 phenergen by Tresa EndoKelly PA

## 2018-01-14 NOTE — Procedures (Signed)
2017/11/10  9:34 PM  PATIENT:  Noah Adams    PRE-PROCEDURE DIAGNOSIS:  L traumatic arthrotomy  POST-OPERATIVE DIAGNOSIS:  Same  PROCEDURE:  Bedside irrigation and closure of L traumatic arthrotomy in setting of total knee arthroplasty  PROCEDURE DETAILS:  After informed verbal consent was obtained from the family we used the traumatic diagonal laceration to knee to irrigated and palpate patellar fragments within the wound.  We then prepped with betadine and closed with 3 interrupted sutures in a sterile fashion.  Sterile dressing and knee immobilizer placed.

## 2018-01-14 NOTE — Anesthesia Preprocedure Evaluation (Signed)
Anesthesia Evaluation  Patient identified by MRN, date of birth, ID band Patient awake    Reviewed: Allergy & Precautions, H&P , NPO status , Patient's Chart, lab work & pertinent test results, reviewed documented beta blocker date and time   Airway Mallampati: II  TM Distance: >3 FB Neck ROM: full    Dental no notable dental hx.    Pulmonary    Pulmonary exam normal breath sounds clear to auscultation       Cardiovascular Exercise Tolerance: Good hypertension, Pt. on medications  Rhythm:regular Rate:Normal  EKG 7/26 Sinus rhythm Atrial premature complexes in couplets Nonspecific T wave abnormality   Neuro/Psych    GI/Hepatic   Endo/Other  Hypothyroidism   Renal/GU   negative genitourinary   Musculoskeletal   Abdominal   Peds  Hematology   Anesthesia Other Findings   Reproductive/Obstetrics                             Anesthesia Physical Anesthesia Plan  ASA: III  Anesthesia Plan: General   Post-op Pain Management:    Induction: Intravenous and Cricoid pressure planned  PONV Risk Score and Plan: 2 and Ondansetron and Treatment may vary due to age or medical condition  Airway Management Planned: Oral ETT  Additional Equipment:   Intra-op Plan:   Post-operative Plan: Extubation in OR  Informed Consent: I have reviewed the patients History and Physical, chart, labs and discussed the procedure including the risks, benefits and alternatives for the proposed anesthesia with the patient or authorized representative who has indicated his/her understanding and acceptance.   Dental Advisory Given  Plan Discussed with: CRNA, Anesthesiologist and Surgeon  Anesthesia Plan Comments: (  )        Anesthesia Quick Evaluation
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
## 2018-01-14 NOTE — Anesthesia Procedure Notes (Signed)
Procedure Name: Intubation Date/Time: 01/11/2018 7:00 PM Performed by: Izola Priceockfield, Oberia Beaudoin Walton Jr., CRNA Pre-anesthesia Checklist: Patient identified, Emergency Drugs available, Suction available and Patient being monitored Patient Re-evaluated:Patient Re-evaluated prior to induction Oxygen Delivery Method: Circle system utilized Preoxygenation: Pre-oxygenation with 100% oxygen Induction Type: IV induction and Rapid sequence Laryngoscope Size: McGraph and 4 Tube type: Oral Tube size: 7.5 mm Number of attempts: 1 Airway Equipment and Method: Stylet and Video-laryngoscopy Placement Confirmation: ETT inserted through vocal cords under direct vision,  positive ETCO2 and breath sounds checked- equal and bilateral Secured at: 22 cm Tube secured with: Tape Dental Injury: Teeth and Oropharynx as per pre-operative assessment

## 2018-01-14 NOTE — ED Notes (Signed)
2nd unit of RBC's O-neg started at bolus rate.
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
## 2018-01-15 ENCOUNTER — Other Ambulatory Visit: Payer: Self-pay

## 2018-01-15 ENCOUNTER — Inpatient Hospital Stay: Payer: Self-pay

## 2018-01-15 ENCOUNTER — Inpatient Hospital Stay (HOSPITAL_COMMUNITY): Payer: No Typology Code available for payment source

## 2018-01-15 ENCOUNTER — Encounter (HOSPITAL_COMMUNITY): Payer: Self-pay | Admitting: Anesthesiology

## 2018-01-15 ENCOUNTER — Encounter (HOSPITAL_COMMUNITY): Admission: EM | Disposition: E | Payer: Self-pay | Source: Home / Self Care

## 2018-01-15 LAB — CBC
HEMATOCRIT: 31.3 % — AB (ref 39.0–52.0)
HEMATOCRIT: 27.1 % — AB (ref 39.0–52.0)
HEMOGLOBIN: 8.7 g/dL — AB (ref 13.0–17.0)
Hemoglobin: 9.8 g/dL — ABNORMAL LOW (ref 13.0–17.0)
MCH: 29.8 pg (ref 26.0–34.0)
MCH: 29.8 pg (ref 26.0–34.0)
MCHC: 31.3 g/dL (ref 30.0–36.0)
MCHC: 32.1 g/dL (ref 30.0–36.0)
MCV: 95.1 fL (ref 78.0–100.0)
MCV: 92.8 fL (ref 78.0–100.0)
Platelets: 127 10*3/uL — ABNORMAL LOW (ref 150–400)
Platelets: 137 10*3/uL — ABNORMAL LOW (ref 150–400)
RBC: 3.29 MIL/uL — ABNORMAL LOW (ref 4.22–5.81)
RBC: 2.92 MIL/uL — AB (ref 4.22–5.81)
RDW: 14.6 % (ref 11.5–15.5)
RDW: 14.7 % (ref 11.5–15.5)
WBC: 18.2 10*3/uL — AB (ref 4.0–10.5)
WBC: 15.7 10*3/uL — AB (ref 4.0–10.5)

## 2018-01-15 LAB — HEMOGLOBIN AND HEMATOCRIT, BLOOD
HCT: 29.8 % — ABNORMAL LOW (ref 39.0–52.0)
HEMOGLOBIN: 9.7 g/dL — AB (ref 13.0–17.0)

## 2018-01-15 LAB — PREPARE FRESH FROZEN PLASMA
UNIT DIVISION: 0
Unit division: 0

## 2018-01-15 LAB — POCT I-STAT 3, ART BLOOD GAS (G3+)
Acid-base deficit: 13 mmol/L — ABNORMAL HIGH (ref 0.0–2.0)
Bicarbonate: 13.3 mmol/L — ABNORMAL LOW (ref 20.0–28.0)
O2 SAT: 97 %
PCO2 ART: 31.9 mmHg — AB (ref 32.0–48.0)
PH ART: 7.229 — AB (ref 7.350–7.450)
PO2 ART: 111 mmHg — AB (ref 83.0–108.0)
TCO2: 14 mmol/L — AB (ref 22–32)

## 2018-01-15 LAB — APTT: aPTT: 32 seconds (ref 24–36)

## 2018-01-15 LAB — COMPREHENSIVE METABOLIC PANEL
ALBUMIN: 2.4 g/dL — AB (ref 3.5–5.0)
ALT: 47 U/L — ABNORMAL HIGH (ref 0–44)
AST: 74 U/L — AB (ref 15–41)
Alkaline Phosphatase: 48 U/L (ref 38–126)
Anion gap: 14 (ref 5–15)
BILIRUBIN TOTAL: 1.2 mg/dL (ref 0.3–1.2)
BUN: 23 mg/dL (ref 8–23)
CO2: 12 mmol/L — ABNORMAL LOW (ref 22–32)
Calcium: 7.6 mg/dL — ABNORMAL LOW (ref 8.9–10.3)
Chloride: 114 mmol/L — ABNORMAL HIGH (ref 98–111)
Creatinine, Ser: 1.74 mg/dL — ABNORMAL HIGH (ref 0.61–1.24)
GFR calc Af Amer: 41 mL/min — ABNORMAL LOW (ref >60)
GFR calc non Af Amer: 35 mL/min — ABNORMAL LOW (ref >60)
GLUCOSE: 297 mg/dL — AB (ref 70–99)
POTASSIUM: 5 mmol/L (ref 3.5–5.1)
Sodium: 140 mmol/L (ref 135–145)
TOTAL PROTEIN: 4.5 g/dL — AB (ref 6.5–8.1)

## 2018-01-15 LAB — BPAM FFP
BLOOD PRODUCT EXPIRATION DATE: 201907302359
Blood Product Expiration Date: 201907302359
ISSUE DATE / TIME: 201907261648
ISSUE DATE / TIME: 201907261732
Unit Type and Rh: 6200
Unit Type and Rh: 6200

## 2018-01-15 LAB — PROTIME-INR
INR: 1.37
PROTHROMBIN TIME: 16.8 s — AB (ref 11.4–15.2)

## 2018-01-15 LAB — PREPARE RBC (CROSSMATCH)

## 2018-01-15 LAB — LACTIC ACID, PLASMA
Lactic Acid, Venous: 5 mmol/L (ref 0.5–1.9)
Lactic Acid, Venous: 5 mmol/L (ref 0.5–1.9)

## 2018-01-15 SURGERY — INCISION AND DRAINAGE
Anesthesia: General | Laterality: Right

## 2018-01-15 MED ORDER — NOREPINEPHRINE 16 MG/250ML-% IV SOLN
0.0000 ug/min | INTRAVENOUS | Status: DC
  Administered 2018-01-15: 38 ug/min via INTRAVENOUS
  Administered 2018-01-15 – 2018-01-16 (×2): 40 ug/min via INTRAVENOUS
  Administered 2018-01-16: 38 ug/min via INTRAVENOUS
  Administered 2018-01-17: 39 ug/min via INTRAVENOUS
  Administered 2018-01-17: 40 ug/min via INTRAVENOUS
  Administered 2018-01-17: 23 ug/min via INTRAVENOUS
  Administered 2018-01-18: 24 ug/min via INTRAVENOUS
  Filled 2018-01-15 (×11): qty 250

## 2018-01-15 MED ORDER — SODIUM CHLORIDE 0.9% FLUSH: 10.0000 mL | INTRAVENOUS | Status: DC | PRN

## 2018-01-15 MED ORDER — CHLORHEXIDINE GLUCONATE CLOTH 2 % EX PADS
6.0000 | MEDICATED_PAD | Freq: Every day | CUTANEOUS | Status: DC
  Administered 2018-01-15 – 2018-01-17 (×3): 6 via TOPICAL

## 2018-01-15 MED ORDER — SODIUM CHLORIDE 0.9 % IV BOLUS
1000.0000 mL | Freq: Once | INTRAVENOUS | Status: AC
  Administered 2018-01-15: 1000 mL via INTRAVENOUS

## 2018-01-15 MED ORDER — SODIUM CHLORIDE 0.9% FLUSH
10.0000 mL | Freq: Two times a day (BID) | INTRAVENOUS | Status: DC
  Administered 2018-01-15 – 2018-01-17 (×4): 10 mL

## 2018-01-15 MED ORDER — ALBUMIN HUMAN 5 % IV SOLN
25.0000 g | Freq: Once | INTRAVENOUS | Status: AC
Start: 2018-01-15 — End: 2018-01-15
  Administered 2018-01-15: 25 g via INTRAVENOUS
  Filled 2018-01-15: qty 500

## 2018-01-15 MED ORDER — CHLORHEXIDINE GLUCONATE CLOTH 2 % EX PADS: 6.0000 | MEDICATED_PAD | Freq: Every day | CUTANEOUS | Status: DC

## 2018-01-15 MED ORDER — SODIUM CHLORIDE 0.9 % IV SOLN
INTRAVENOUS | Status: DC
  Administered 2018-01-15 – 2018-01-16 (×2): via INTRAVENOUS

## 2018-01-15 MED ORDER — SODIUM CHLORIDE 0.9% IV SOLUTION
Freq: Once | INTRAVENOUS | Status: AC
  Administered 2018-01-15: 14:00:00 via INTRAVENOUS

## 2018-01-15 NOTE — Anesthesia Preprocedure Evaluation (Deleted)
Anesthesia Evaluation    Reviewed: Allergy & Precautions, H&P , Patient's Chart, lab work & pertinent test results, reviewed documented beta blocker date and time   History of Anesthesia Complications Negative for: history of anesthetic complications  Airway Mallampati: Intubated       Dental no notable dental hx.    Pulmonary neg pulmonary ROS,    Pulmonary exam normal breath sounds clear to auscultation       Cardiovascular Exercise Tolerance: Good hypertension, Pt. on medications  Rhythm:regular Rate:Normal  EKG 7/26 Sinus rhythm Atrial premature complexes in couplets Nonspecific T wave abnormality   Neuro/Psych negative neurological ROS  negative psych ROS   GI/Hepatic negative GI ROS, Neg liver ROS,   Endo/Other  Hypothyroidism   Renal/GU negative Renal ROS  negative genitourinary   Musculoskeletal   Abdominal   Peds  Hematology   Anesthesia Other Findings   Reproductive/Obstetrics                            Anesthesia Physical  Anesthesia Plan  ASA: III  Anesthesia Plan: General   Post-op Pain Management:    Induction: Intravenous  PONV Risk Score and Plan: 3 and Ondansetron, Treatment may vary due to age or medical condition, Dexamethasone and Diphenhydramine  Airway Management Planned: Oral ETT  Additional Equipment:   Intra-op Plan:   Post-operative Plan: Post-operative intubation/ventilation  Informed Consent:   Plan Discussed with: Anesthesiologist  Anesthesia Plan Comments: (  )       Anesthesia Quick Evaluation

## 2018-01-15 NOTE — Progress Notes (Signed)
Patient ID: Noah HemanCharles E Adams, male   DOB: 10/03/1933, 84 y.o.   MRN: [id number] Events of the evening noted patient apparently went unresponsive upon arrival to the unit this had a poor neurologic exam requiring heavy sedation and pressor support for blood pressure pressure.  Grimaces to pain minimal movement upper and lower extremities 1 minus sedation nursing has not been able to get any lower [name] movement has had some minimal withdrawal with upper extremities.  Will obtain stat CT of his head and thoracic spine. He is scheduled to go the OR for orthopedic stabilization of femur and pelvis
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
## 2018-01-15 NOTE — Progress Notes (Signed)
Orthopedic Tech Progress Note Patient Details:  Noah Adams 13-Jun-1934 [id number]  Musculoskeletal Traction Type of Traction: [place]ucks Skin Traction Traction Weight: 5 lbs       [name] 01/01/2018, 6:43 PM

## 2018-01-15 NOTE — Procedures (Signed)
Arterial Catheter Insertion Procedure Note Noah Adams [id number] February 06, 1934  Procedure: Insertion of Arterial Catheter  Indications: Blood pressure monitoring and Frequent blood sampling  Procedure Details Consent: Unable to obtain consent because of altered level of consciousness. Time Out: Verified patient identification, verified procedure, site/side was marked, verified correct patient position, special equipment/implants available, medications/allergies/relevent history reviewed, required imaging and test results available.  Performed  Maximum sterile technique was used including antiseptics, cap, gloves, gown, hand hygiene, mask and sheet. Skin prep: Chlorhexidine; local anesthetic administered 20 gauge catheter was inserted into right radial artery using the Seldinger technique. ULTRASOUND GUIDANCE USED: NO Evaluation Blood flow good; BP tracing good. Complications: No apparent complications.  Arterial line placed despite negative dopler for colatteral circulation per Dr. Margaree Mackintoshsui.  Line placed on the right using with one attempt.  Normal saline flush used, line leveled and zeroed, site dressed securely per RT protocol.    Noah Adams, Noah Adams 12/24/2017
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
## 2018-01-15 NOTE — Progress Notes (Signed)
Stopped in to visit with patient and family.  No family present but spoke with daughter, Mardelle Mattendy, earlier in the waiting room.  She is hopeful and sad at the possibility of major injury.  Will continue to support her and her family with prayer and presence.  Please page us as additional support is needed.    05/06/18 2124  Clinical Encounter Type  Visited With Patient;Health care provider  Visit Type Follow-up;Spiritual support;Social support;Code;ED;Trauma

## 2018-01-15 NOTE — Progress Notes (Signed)
Patient ID: Rayann HemanCharles E Chagoya, male   DOB: 1934-01-18, 84 y.o.   MRN: [id number] Reviewed CT head T-spine with radiology head CT really unremarkable with no insight as to decline in mental status. CT of his thoracic spine shows no significant epidural hematoma or displacement in the canal although there is additional widening of the disc space consistent with his chance fracture at T8.  Still very difficult exam patient requiring extensive sedation and blood pressure support. Unclear to what extent he may have had a spinal cord injury.  We'll order a thoracic spine MRI to take a look of the spinal cord in addition continue workup and support blood pressure recommend continued resuscitation hold off on any surgical correction in either orthopedic or spine at this point. If he is paraplegic possibly could be related to spinal cord infarction and with his degree of hemodynamic instability post greater risk for surgical correction at this point with minimal to no chance of neurologic recovery.

## 2018-01-15 NOTE — Progress Notes (Signed)
The patient was transported to CT and back without incident. The patient is resting on his ordered vent settings at this time.  

## 2018-01-15 NOTE — Progress Notes (Signed)
The patient's arterial line waveform was dampened. The arterial line was repositioned and redressed. RT is able to flush the line as well as draw off the line.

## 2018-01-15 NOTE — Progress Notes (Signed)
Peripherally Inserted Central Catheter/Midline Placement  The IV Nurse has discussed with the patient and/or persons authorized to consent for the patient, the purpose of this procedure and the potential benefits and risks involved with this procedure.  The benefits include less needle sticks, lab draws from the catheter, and the patient may be discharged home with the catheter. Risks include, but not limited to, infection, bleeding, blood clot (thrombus formation), and puncture of an artery; nerve damage and irregular heartbeat and possibility to perform a PICC exchange if needed/ordered by physician.  Alternatives to this procedure were also discussed.  Bard Power PICC patient education guide, fact sheet on infection prevention and patient information card has been provided to patient /or left at bedside.  Daughters at bedside signed consent for PICC placement.  PICC/Midline Placement Documentation  PICC Triple Lumen 01/02/2018 PICC Left Brachial 49 cm 1 cm (Active)  Indication for Insertion or Continuance of Line Vasoactive infusions;Chronic illness with exacerbations (CF, Sickle Cell, etc.);Prolonged intravenous therapies 01/10/2018 11:13 AM  Exposed Catheter (cm) 1 cm 12/27/2017 11:13 AM  Site Assessment Clean;Dry;Intact 01/02/2018 11:13 AM  Lumen #1 Status Flushed;Saline locked;Blood return noted 12/31/2017 11:13 AM  Lumen #2 Status Flushed;Saline locked;Blood return noted 12/25/2017 11:13 AM  Lumen #3 Status Flushed;Saline locked;Blood return noted 12/23/2017 11:13 AM  Dressing Type Transparent 12/27/2017 11:13 AM  Dressing Status Clean;Dry;Intact;Antimicrobial disc in place 12/29/2017 11:13 AM  Line Care Connections checked and tightened 01/09/2018 11:13 AM  Line Adjustment (NICU/IV Team Only) No 01/13/2018 11:13 AM  Dressing Intervention New dressing 01/12/2018 11:13 AM  Dressing Change Due 01/22/18 01/05/2018 11:13 AM       Elliot Dallyiggs, Harel Repetto Wright 12/21/2017, 11:15 AM

## 2018-01-15 NOTE — Progress Notes (Signed)
Patient ID: Noah HemanCharles E Adams, male   DOB: 06/16/1934, 84 y.o.   MRN: [id number] Had an extensive discussion with the patient's family regarding events of the evening and prognosis this morning. Explained that the patient's hemodynamic instability limits our ability to address some of his acute injuries that were also in process of continuing the workup that were planning on an MRI scan when it seemed that he is stable enough to go down for but that we wouldn't be able to surgically correct his spine or his orthopedic injuries until he was more stable. I also explained that its likely that he could've sustained a spinal cord injury and may be paralyzed and that surgery would not be able to reverse this.
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
## 2018-01-16 ENCOUNTER — Other Ambulatory Visit: Payer: Self-pay

## 2018-01-16 ENCOUNTER — Inpatient Hospital Stay (HOSPITAL_COMMUNITY): Payer: No Typology Code available for payment source

## 2018-01-16 ENCOUNTER — Encounter (HOSPITAL_COMMUNITY): Payer: Self-pay | Admitting: Nephrology

## 2018-01-16 LAB — SODIUM, URINE, RANDOM

## 2018-01-16 LAB — BASIC METABOLIC PANEL
ANION GAP: 9 (ref 5–15)
BUN: 37 mg/dL — ABNORMAL HIGH (ref 8–23)
CALCIUM: 7.6 mg/dL — AB (ref 8.9–10.3)
CO2: 17 mmol/L — ABNORMAL LOW (ref 22–32)
Chloride: 111 mmol/L (ref 98–111)
Creatinine, Ser: 3.72 mg/dL — ABNORMAL HIGH (ref 0.61–1.24)
GFR calc Af Amer: 16 mL/min — ABNORMAL LOW (ref >60)
GFR, EST NON AFRICAN AMERICAN: 14 mL/min — AB (ref >60)
GLUCOSE: 191 mg/dL — AB (ref 70–99)
Potassium: 5.5 mmol/L — ABNORMAL HIGH (ref 3.5–5.1)
Sodium: 137 mmol/L (ref 135–145)

## 2018-01-16 LAB — GLUCOSE, CAPILLARY: GLUCOSE-CAPILLARY: 148 mg/dL — AB (ref 70–99)

## 2018-01-16 LAB — CREATININE, URINE, RANDOM: CREATININE, URINE: 142.64 mg/dL

## 2018-01-16 LAB — CBC
HCT: 28.3 % — ABNORMAL LOW (ref 39.0–52.0)
Hemoglobin: 9.1 g/dL — ABNORMAL LOW (ref 13.0–17.0)
MCH: 29.7 pg (ref 26.0–34.0)
MCHC: 32.2 g/dL (ref 30.0–36.0)
MCV: 92.5 fL (ref 78.0–100.0)
PLATELETS: 93 10*3/uL — AB (ref 150–400)
RBC: 3.06 MIL/uL — ABNORMAL LOW (ref 4.22–5.81)
RDW: 15.7 % — ABNORMAL HIGH (ref 11.5–15.5)
WBC: 18.2 10*3/uL — AB (ref 4.0–10.5)

## 2018-01-16 LAB — LACTIC ACID, PLASMA: LACTIC ACID, VENOUS: 2 mmol/L — AB (ref 0.5–1.9)

## 2018-01-16 MED ORDER — BETHANECHOL CHLORIDE 10 MG PO TABS
10.0000 mg | ORAL_TABLET | Freq: Every day | ORAL | Status: DC
  Administered 2018-01-16: 10 mg
  Filled 2018-01-16 (×2): qty 1

## 2018-01-16 MED ORDER — PIVOT 1.5 CAL PO LIQD
1000.0000 mL | ORAL | Status: DC
  Administered 2018-01-16: 1000 mL

## 2018-01-16 MED ORDER — ALBUMIN HUMAN 5 % IV SOLN
25.0000 g | Freq: Once | INTRAVENOUS | Status: AC
  Administered 2018-01-16: 25 g via INTRAVENOUS
  Filled 2018-01-16: qty 500

## 2018-01-16 MED ORDER — PRO-STAT SUGAR FREE PO LIQD
30.0000 mL | Freq: Two times a day (BID) | ORAL | Status: DC
  Administered 2018-01-16 – 2018-01-17 (×4): 30 mL
  Filled 2018-01-16 (×4): qty 30

## 2018-01-16 MED ORDER — ALBUMIN HUMAN 25 % IV SOLN
25.0000 g | Freq: Three times a day (TID) | INTRAVENOUS | Status: AC
  Administered 2018-01-16 – 2018-01-17 (×6): 25 g via INTRAVENOUS
  Filled 2018-01-16: qty 100
  Filled 2018-01-16 (×2): qty 50
  Filled 2018-01-16 (×3): qty 100

## 2018-01-16 MED ORDER — PIPERACILLIN-TAZOBACTAM IN DEX 2-0.25 GM/50ML IV SOLN
2.2500 g | Freq: Three times a day (TID) | INTRAVENOUS | Status: DC
  Administered 2018-01-16 – 2018-01-17 (×3): 2.25 g via INTRAVENOUS
  Filled 2018-01-16 (×4): qty 50

## 2018-01-16 NOTE — Progress Notes (Signed)
MRI of spine completed. Lab results not yet available. Awaiting either lactic acid or ABG to assess level of resuscitation.  Will proceed with femur repair if resuscitated and stable hemodynamically.  Myrene GalasMichael Adalyna Godbee, MD [facility], [facility] [phone]
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
## 2018-01-16 NOTE — Plan of Care (Signed)
Pt tolerated fio2 of 30% for several hours today

## 2018-01-16 NOTE — Progress Notes (Signed)
The patient's FIO2 was weaned to 30% with o2 sats 95%, however, the patient is currently on Levophed at 38 mcg. An SBT was not performed at this time.

## 2018-01-16 NOTE — Progress Notes (Signed)
Patient having gurgling sound after trasnsport to MRI. STAT CXR ordererd to verify placement of ET tube. Dr. Magnus IvanBlackman notified at [phone]. RT advised RN that ET tube needed to be advanced further. Dr. Magnus IvanBlackman agreed. RT advanced ET tube to 25cm at the lips. RN will continue to monitor.

## 2018-01-16 NOTE — Plan of Care (Signed)
TF initiated today; pt tolerating with no episodes of emesis

## 2018-01-16 NOTE — Progress Notes (Signed)
PHARMACY NOTE:  ANTIMICROBIAL RENAL DOSAGE ADJUSTMENT  Current antimicrobial regimen includes a mismatch between antimicrobial dosage and estimated renal function.  As per policy approved by the Pharmacy & Therapeutics and Medical Executive Committees, the antimicrobial dosage will be adjusted accordingly.  Current antimicrobial dosage:  Zosyn 3.375g IV q8h  Indication: Aspiration PNA  Renal Function:  Estimated Creatinine Clearance: 18.8 mL/min (A) (by C-G formula based on SCr of 3.72 mg/dL (H)). []      On intermittent HD, scheduled: []      On CRRT    Antimicrobial dosage has been changed to:  Zosyn 2.25g IV q8h  Thank you for allowing pharmacy to be a part of this patient's care.  Roderic ScarceErin N. Zigmund Danieleja, PharmD PGY2 Infectious Disease Pharmacy Resident Phone: [phone] 01/16/2018 12:35 PM
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
## 2018-01-16 NOTE — Progress Notes (Addendum)
Orthopaedic Trauma Service (OTS)  2 Days Post-Op Procedure(s) (LRB): IRRIGATION AND DEBRIDEMENT EXTREMITY (Left) EXTERNAL FIXATION LEG (Left) INSERTION OF TRACTION PIN (Right)  Subjective and course update: Patient intubated and sedated. Underwent MRI last night. Was posted for possible ortho repair today with lactic acid now 2.0,  but abrupt elevations in potassium and creatinine precluded it.   Objective: Current Vitals Blood pressure (!) 127/58, pulse 100, temperature 98.9 F (37.2 C), temperature source Axillary, resp. rate 18, height 6' 0.5" (1.842 m), weight 95.3 kg (210 lb), SpO2 96 %. Vital signs in last 24 hours: Temp:  [97.5 F (36.4 C)-98.9 F (37.2 C)] 98.9 F (37.2 C) (07/28 1156) Pulse Rate:  [62-100] 100 (07/28 1345) Resp:  [10-19] 18 (07/28 1345) BP: (57-144)/(28-106) 127/58 (07/28 1345) SpO2:  [80 %-100 %] 96 % (07/28 1345) Arterial Line BP: (65-125)/(46-73) 73/67 (07/28 1345) FiO2 (%):  [30 %-70 %] 30 % (07/28 1128)  Intake/Output from previous day: 07/27 0701 - 07/28 0700 In: 4989 [I.V.:3959.7; Blood:711.3; IV Piggyback:318] Out: 275 [Urine:75; Emesis/NG output:200]  LABS Recent Labs    01/11/2018 1851 12/24/2017 0204 01/13/2018 1007 12/29/2017 2033 01/16/18 0549  HGB 11.3* 9.8* 8.7* 9.7* 9.1*   Recent Labs    12/28/2017 1007 01/05/2018 2033 01/16/18 0549  WBC 15.7*  --  18.2*  RBC 2.92*  --  3.06*  HCT 27.1* 29.8* 28.3*  PLT 137*  --  93*   Recent Labs    01/01/2018 0204 01/16/18 0549  NA 140 137  K 5.0 5.5*  CL 114* 111  CO2 12* 17*  BUN 23 37*  CREATININE 1.74* 3.72*  GLUCOSE 297* 191*  CALCIUM 7.6* 7.6*   Recent Labs    01/08/2018 1851 01/02/2018 0204  INR 1.36 1.37     Physical Exam RLE in [facility]     Brisk cap refill, warm to touch LLE Dressing intact, clean, dry on left  Edema/ swelling controlled  Brisk cap refill, warm to touch  PROCEDURE: PLACEMENT OF TIBIAL SKELETAL TRACTION PIN AND 25 LBS OF TRACTION DX: right hip fracture  (now that MRI has been completed) Surgeons: Myrene GalasMichael Soni Kegel, MD, Montez MoritaKeith Paul, PA-C Complications: none  Assessment/Plan: 2 Days Post-Op Procedure(s) (LRB): IRRIGATION AND DEBRIDEMENT EXTREMITY (Left) EXTERNAL FIXATION LEG (Left) INSERTION OF TRACTION PIN (Right) 1. Will follow with Trauma and Neuro and proceed to OR as soon as stability allows 2. Tentatively put back on schedule for tomorrow but would seem more likely feasible for patient on Tuesday  Remains critical, after hip fixation spine repair is anticipated  Myrene GalasMichael Gloyd Happ, MD [facility] [phone] (p)

## 2018-01-16 NOTE — Progress Notes (Signed)
Patient ID: Noah HemanCharles E Adams, male   DOB: 12-27-1933, 84 y.o.   MRN: [id number] Patient remains intubated sedated minimal response 1 light and sedation has some withdrawal his upper extremities but no volitional or conscious movements yet. Still no movement of his lower extremities.  MRI scan shows cord contusion however no compressive mass there is CSF space around the spinal cord.  No indication for emergent decompression and fusion will recommend continued maximizing hemo-dynamic stability.continue bedrest bed can be broken 20deg

## 2018-01-17 ENCOUNTER — Encounter (HOSPITAL_COMMUNITY): Payer: Self-pay | Admitting: Orthopedic Surgery

## 2018-01-17 ENCOUNTER — Inpatient Hospital Stay (HOSPITAL_COMMUNITY): Payer: No Typology Code available for payment source

## 2018-01-17 DIAGNOSIS — N179 Acute kidney failure, unspecified: Secondary | ICD-10-CM

## 2018-01-17 DIAGNOSIS — D649 Anemia, unspecified: Secondary | ICD-10-CM

## 2018-01-17 DIAGNOSIS — I6340 Cerebral infarction due to embolism of unspecified cerebral artery: Secondary | ICD-10-CM

## 2018-01-17 DIAGNOSIS — S72141A Displaced intertrochanteric fracture of right femur, initial encounter for closed fracture: Secondary | ICD-10-CM

## 2018-01-17 DIAGNOSIS — I63423 Cerebral infarction due to embolism of bilateral anterior cerebral arteries: Secondary | ICD-10-CM

## 2018-01-17 DIAGNOSIS — M9712XA 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, initial encounter: Secondary | ICD-10-CM

## 2018-01-17 DIAGNOSIS — I4891 Unspecified atrial fibrillation: Secondary | ICD-10-CM

## 2018-01-17 HISTORY — DX: 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, initial encounter: M97.12XA

## 2018-01-17 HISTORY — DX: Displaced intertrochanteric fracture of right femur, initial encounter for closed fracture: S72.141A

## 2018-01-17 LAB — POCT I-STAT 3, ART BLOOD GAS (G3+)
Acid-base deficit: 12 mmol/L — ABNORMAL HIGH (ref 0.0–2.0)
Bicarbonate: 14 mmol/L — ABNORMAL LOW (ref 20.0–28.0)
O2 Saturation: 91 %
PO2 ART: 68 mmHg — AB (ref 83.0–108.0)
TCO2: 15 mmol/L — ABNORMAL LOW (ref 22–32)
pCO2 arterial: 31 mmHg — ABNORMAL LOW (ref 32.0–48.0)
pH, Arterial: 7.263 — ABNORMAL LOW (ref 7.350–7.450)

## 2018-01-17 LAB — BASIC METABOLIC PANEL
Anion gap: 9 (ref 5–15)
BUN: 51 mg/dL — AB (ref 8–23)
CALCIUM: 7.6 mg/dL — AB (ref 8.9–10.3)
CO2: 16 mmol/L — ABNORMAL LOW (ref 22–32)
CREATININE: 5.23 mg/dL — AB (ref 0.61–1.24)
Chloride: 108 mmol/L (ref 98–111)
GFR calc Af Amer: 11 mL/min — ABNORMAL LOW (ref >60)
GFR, EST NON AFRICAN AMERICAN: 9 mL/min — AB (ref >60)
GLUCOSE: 243 mg/dL — AB (ref 70–99)
POTASSIUM: 5.3 mmol/L — AB (ref 3.5–5.1)
SODIUM: 133 mmol/L — AB (ref 135–145)

## 2018-01-17 LAB — GLUCOSE, CAPILLARY
GLUCOSE-CAPILLARY: 174 mg/dL — AB (ref 70–99)
GLUCOSE-CAPILLARY: 196 mg/dL — AB (ref 70–99)
GLUCOSE-CAPILLARY: 83 mg/dL (ref 70–99)
Glucose-Capillary: 163 mg/dL — ABNORMAL HIGH (ref 70–99)
Glucose-Capillary: 199 mg/dL — ABNORMAL HIGH (ref 70–99)
Glucose-Capillary: 160 mg/dL — ABNORMAL HIGH (ref 70–99)
Glucose-Capillary: 121 mg/dL — ABNORMAL HIGH (ref 70–99)

## 2018-01-17 LAB — RENAL FUNCTION PANEL
ANION GAP: 12 (ref 5–15)
Albumin: 3.4 g/dL — ABNORMAL LOW (ref 3.5–5.0)
BUN: 51 mg/dL — ABNORMAL HIGH (ref 8–23)
CALCIUM: 7.7 mg/dL — AB (ref 8.9–10.3)
CO2: 16 mmol/L — AB (ref 22–32)
Chloride: 107 mmol/L (ref 98–111)
Creatinine, Ser: 5.02 mg/dL — ABNORMAL HIGH (ref 0.61–1.24)
GFR calc Af Amer: 11 mL/min — ABNORMAL LOW (ref >60)
GFR calc non Af Amer: 10 mL/min — ABNORMAL LOW (ref >60)
GLUCOSE: 244 mg/dL — AB (ref 70–99)
Phosphorus: 5.8 mg/dL — ABNORMAL HIGH (ref 2.5–4.6)
Potassium: 5.3 mmol/L — ABNORMAL HIGH (ref 3.5–5.1)
Sodium: 135 mmol/L (ref 135–145)

## 2018-01-17 LAB — CBC WITH DIFFERENTIAL/PLATELET
BASOS ABS: 0 10*3/uL (ref 0.0–0.1)
Basophils Relative: 0 %
EOS ABS: 0 10*3/uL (ref 0.0–0.7)
Eosinophils Relative: 0 %
HCT: 19.8 % — ABNORMAL LOW (ref 39.0–52.0)
HEMOGLOBIN: 6.3 g/dL — AB (ref 13.0–17.0)
LYMPHS PCT: 8 %
Lymphs Abs: 0.9 10*3/uL (ref 0.7–4.0)
MCH: 30.4 pg (ref 26.0–34.0)
MCHC: 31.8 g/dL (ref 30.0–36.0)
MCV: 95.7 fL (ref 78.0–100.0)
Monocytes Absolute: 1 10*3/uL (ref 0.1–1.0)
Monocytes Relative: 9 %
Neutro Abs: 9.1 10*3/uL — ABNORMAL HIGH (ref 1.7–7.7)
Neutrophils Relative %: 83 %
Platelets: 72 10*3/uL — ABNORMAL LOW (ref 150–400)
RBC: 2.07 MIL/uL — ABNORMAL LOW (ref 4.22–5.81)
RDW: 15.8 % — ABNORMAL HIGH (ref 11.5–15.5)
WBC: 11 10*3/uL — ABNORMAL HIGH (ref 4.0–10.5)

## 2018-01-17 LAB — TROPONIN I
TROPONIN I: 0.77 ng/mL — AB (ref <0.03)
Troponin I: 0.82 ng/mL (ref <0.03)

## 2018-01-17 LAB — HEMOGLOBIN AND HEMATOCRIT, BLOOD
HEMATOCRIT: 27.2 % — AB (ref 39.0–52.0)
HEMOGLOBIN: 8.7 g/dL — AB (ref 13.0–17.0)

## 2018-01-17 LAB — PREPARE RBC (CROSSMATCH)

## 2018-01-17 LAB — MAGNESIUM
MAGNESIUM: 2.2 mg/dL (ref 1.7–2.4)
Magnesium: 1.7 mg/dL (ref 1.7–2.4)

## 2018-01-17 LAB — HEMOGLOBIN A1C
HEMOGLOBIN A1C: 5.9 % — AB (ref 4.8–5.6)
Mean Plasma Glucose: 122.63 mg/dL

## 2018-01-17 LAB — BLOOD PRODUCT ORDER (VERBAL) VERIFICATION

## 2018-01-17 LAB — LACTIC ACID, PLASMA: LACTIC ACID, VENOUS: 1.6 mmol/L (ref 0.5–1.9)

## 2018-01-17 MED ORDER — PIVOT 1.5 CAL PO LIQD: 1000.0000 mL | ORAL | Status: DC

## 2018-01-17 MED ORDER — PRISMASOL BGK 4/2.5 32-4-2.5 MEQ/L IV SOLN
INTRAVENOUS | Status: DC
  Administered 2018-01-17 – 2018-01-18 (×3): via INTRAVENOUS_CENTRAL

## 2018-01-17 MED ORDER — PIPERACILLIN-TAZOBACTAM 3.375 G IVPB
3.3750 g | Freq: Four times a day (QID) | INTRAVENOUS | Status: DC
  Administered 2018-01-17 – 2018-01-18 (×3): 3.375 g via INTRAVENOUS
  Filled 2018-01-17 (×6): qty 50

## 2018-01-17 MED ORDER — PRISMASOL BGK 4/2.5 32-4-2.5 MEQ/L IV SOLN
INTRAVENOUS | Status: DC
  Administered 2018-01-17 – 2018-01-18 (×5): via INTRAVENOUS_CENTRAL

## 2018-01-17 MED ORDER — PRISMASOL BGK 4/2.5 32-4-2.5 MEQ/L IV SOLN
INTRAVENOUS | Status: DC
  Administered 2018-01-17 – 2018-01-18 (×2): via INTRAVENOUS_CENTRAL

## 2018-01-17 MED ORDER — MAGNESIUM SULFATE 2 GM/50ML IV SOLN
2.0000 g | Freq: Once | INTRAVENOUS | Status: AC
  Administered 2018-01-17: 2 g via INTRAVENOUS
  Filled 2018-01-17: qty 50

## 2018-01-17 MED ORDER — LIDOCAINE HCL (PF) 1 % IJ SOLN
INTRAMUSCULAR | Status: AC
  Filled 2018-01-17: qty 5

## 2018-01-17 MED ORDER — LEVOTHYROXINE SODIUM 150 MCG PO TABS
150.0000 ug | ORAL_TABLET | Freq: Every day | ORAL | Status: DC
  Filled 2018-01-17: qty 1

## 2018-01-17 MED ORDER — MAGNESIUM SULFATE 4 GM/100ML IV SOLN
4.0000 g | Freq: Once | INTRAVENOUS | Status: DC
  Filled 2018-01-17: qty 100

## 2018-01-17 MED ORDER — INSULIN GLARGINE 100 UNIT/ML ~~LOC~~ SOLN
10.0000 [IU] | Freq: Every day | SUBCUTANEOUS | Status: DC
  Administered 2018-01-17: 10 [IU] via SUBCUTANEOUS
  Filled 2018-01-17: qty 0.1

## 2018-01-17 MED ORDER — SODIUM CHLORIDE 0.9 % FOR CRRT: INTRAVENOUS_CENTRAL | Status: DC | PRN

## 2018-01-17 MED ORDER — SODIUM CHLORIDE 0.9% IV SOLUTION
Freq: Once | INTRAVENOUS | Status: AC
  Administered 2018-01-17: 13:00:00 via INTRAVENOUS

## 2018-01-17 MED ORDER — INSULIN ASPART 100 UNIT/ML ~~LOC~~ SOLN
0.0000 [IU] | SUBCUTANEOUS | Status: DC
  Administered 2018-01-17: 1 [IU] via SUBCUTANEOUS
  Administered 2018-01-17 (×3): 3 [IU] via SUBCUTANEOUS

## 2018-01-17 MED ORDER — POVIDONE-IODINE 10 % EX SWAB: 2.0000 "application " | Freq: Once | CUTANEOUS | Status: DC

## 2018-01-17 MED ORDER — CHLORHEXIDINE GLUCONATE 4 % EX LIQD
60.0000 mL | Freq: Once | CUTANEOUS | Status: DC
  Filled 2018-01-17: qty 60

## 2018-01-17 MED ORDER — SODIUM CHLORIDE 0.9 % IV SOLN
1.0000 g | Freq: Once | INTRAVENOUS | Status: AC
  Administered 2018-01-17: 1 g via INTRAVENOUS
  Filled 2018-01-17: qty 10

## 2018-01-17 MED ORDER — HEPARIN SODIUM (PORCINE) 1000 UNIT/ML DIALYSIS: 1000.0000 [IU] | INTRAMUSCULAR | Status: DC | PRN

## 2018-01-17 MED ORDER — HEPARIN SODIUM (PORCINE) 1000 UNIT/ML DIALYSIS
1000.0000 [IU] | INTRAMUSCULAR | Status: DC | PRN
  Filled 2018-01-17: qty 6

## 2018-01-17 MED ORDER — HEPARIN SODIUM (PORCINE) 1000 UNIT/ML DIALYSIS
1000.0000 [IU] | INTRAMUSCULAR | Status: DC | PRN
  Administered 2018-01-17: 2800 [IU] via INTRAVENOUS_CENTRAL
  Filled 2018-01-17 (×3): qty 6

## 2018-01-17 NOTE — Progress Notes (Signed)
Dr. Donell BeersByerly notified of critical Trop 0.82, Potassium 5.3, Magnesium 1.7 and EKG results. Verbal order placed to recheck Trop in 6hrs and Mg sulfate twice. Order clarified to 2g mg sulfate. RN will continue to monitor.

## 2018-01-17 NOTE — Procedures (Signed)
Arterial Catheter Insertion Procedure Note Noah Adams [id number] 1934-02-27  Procedure: Insertion of Arterial Catheter  Indications: Blood pressure monitoring  Procedure Details Consent: Unable to obtain consent because of altered level of consciousness. Time Out: Verified patient identification, verified procedure, site/side was marked, verified correct patient position, special equipment/implants available, medications/allergies/relevent history reviewed, required imaging and test results available.  Performed  Maximum sterile technique was used including antiseptics, gloves and mask. Skin prep: Chlorhexidine; local anesthetic administered 20 gauge catheter was inserted into left radial artery using the Seldinger technique. ULTRASOUND GUIDANCE USED: NO Evaluation Blood flow good; BP tracing good. Complications: No apparent complications.   Noah Adams 01/17/2018

## 2018-01-17 NOTE — Progress Notes (Signed)
Patient ID: Noah Adams, male   DOB: 1934-01-30, 84 y.o.   MRN: [id number] No significant change patient remains intubated and unresponsive  There is been some minimal withdrawal upper extremities witnessed by nursing patient remains on pressor support. MRI scan shows no significant compressive mass continue supportive care

## 2018-01-17 NOTE — Progress Notes (Signed)
Inpatient Diabetes Program Recommendations  AACE/ADA: New Consensus Statement on Inpatient Glycemic Control (2019)  Target Ranges:  Prepandial:   less than 140 mg/dL      Peak postprandial:   less than 180 mg/dL (1-2 hours)      Critically ill patients:  140 - 180 mg/dL   Results for Noah Adams, Noah Adams (MRN [id number]) as of 01/17/2018 08:26  Ref. Range 01/16/2018 19:57 01/16/2018 23:33 01/17/2018 03:37  Glucose-Capillary Latest Ref Range: 70 - 99 mg/dL 409148 (H) 811174 (H) 914163 (H)   Review of Glycemic Control  Current orders for Inpatient glycemic control: None  Inpatient Diabetes Program Recommendations: Correction (SSI): Please consider ordering CBGs with Novolog correction scale Q4H (per ICU Glycemic Control order set).  Thanks, Orlando PennerMarie Muskaan Smet, RN, MSN, CDE Diabetes Coordinator Inpatient Diabetes Program [phone] (Team Pager from 8am to 5pm)
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
## 2018-01-17 NOTE — Progress Notes (Signed)
Patient has new onset of irregular heart rate reaching the 150's and rhythm. Dr. Donell BeersByerly paged. Obtaining BMET, Magnesium,  Trop I and EKG STAT per MD orders. RN will continue to monitor.

## 2018-01-17 NOTE — Procedures (Signed)
Central Venous Catheter Insertion Procedure Note Noah HemanCharles E Adams [id number] Sep 07, 1933  Procedure: Insertion of Central Venous Catheter Indications: Large luminal catheter for dialysis access  Procedure Details Consent: Risks of procedure as well as the alternatives and risks of each were explained to the (patient/caregiver).  Consent for procedure obtained. Time Out: Verified patient identification, verified procedure, site/side was marked, verified correct patient position, special equipment/implants available, medications/allergies/relevent history reviewed, required imaging and test results available.  Performed  Maximum sterile technique was used including antiseptics, cap, gloves, gown, hand hygiene, mask and sheet. Skin prep: Chlorhexidine; local anesthetic administered A antimicrobial bonded/coated triple lumen catheter was placed in the left subclavian vein using the Seldinger technique.  Evaluation Blood flow excellent Complications: No apparent complications Patient did tolerate procedure well. Chest X-ray ordered to verify placement.  CXR: pending.  Jimmye NormanJames Teresita Fanton 01/17/2018, 11:18 AM
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
## 2018-01-17 NOTE — Progress Notes (Signed)
PHARMACY NOTE:  ANTIMICROBIAL RENAL DOSAGE ADJUSTMENT  Current antimicrobial regimen includes a mismatch between antimicrobial dosage and estimated renal function.  As per policy approved by the Pharmacy & Therapeutics and Medical Executive Committees, the antimicrobial dosage will be adjusted accordingly.  Current antimicrobial dosage:  Zosyn 2.25gm IV Q8H  Indication:  Aspiration PNA  Renal Function:  Estimated Creatinine Clearance: 15.6 mL/min (A) (by C-G formula based on SCr of 5.02 mg/dL (H)). []      On intermittent HD, scheduled: [x]      On CRRT >> started 01/17/18 around 1745    Antimicrobial dosage has been changed to:  Zosyn 3.375gm IV Q6H  Additional comments:  Monitor CRRT tolerance/interruptions  Thank you for allowing pharmacy to be a part of this patient's care.  Phillips ClimesDang, Menaal Russum Gilt EdgeDien, [facility] ReadingRPH 01/17/2018 5:56 PM
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
## 2018-01-17 NOTE — Progress Notes (Signed)
CRITICAL VALUE ALERT   Critical Value:  Hgb 6.3    Date & Time Notied:  01/17/2018 10:55 AM  Provider Notified: Dr. Lindie SpruceWyatt  Orders Received/Actions taken: 2 units RBC's ordered
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
## 2018-01-18 ENCOUNTER — Encounter (HOSPITAL_COMMUNITY): Admission: EM | Disposition: E | Payer: Self-pay | Source: Home / Self Care

## 2018-01-18 ENCOUNTER — Other Ambulatory Visit (HOSPITAL_COMMUNITY): Payer: Medicare Other

## 2018-01-18 ENCOUNTER — Inpatient Hospital Stay (HOSPITAL_COMMUNITY): Payer: No Typology Code available for payment source

## 2018-01-18 DIAGNOSIS — Z978 Presence of other specified devices: Secondary | ICD-10-CM

## 2018-01-18 DIAGNOSIS — I480 Paroxysmal atrial fibrillation: Secondary | ICD-10-CM

## 2018-01-18 DIAGNOSIS — Z515 Encounter for palliative care: Secondary | ICD-10-CM

## 2018-01-18 LAB — TYPE AND SCREEN
ABO/RH(D): O NEG
Antibody Screen: NEGATIVE
UNIT DIVISION: 0
UNIT DIVISION: 0
Unit division: 0
Unit division: 0
Unit division: 0
Unit division: 0
Unit division: 0
Unit division: 0

## 2018-01-18 LAB — RENAL FUNCTION PANEL
ANION GAP: 10 (ref 5–15)
Albumin: 3.6 g/dL (ref 3.5–5.0)
BUN: 44 mg/dL — ABNORMAL HIGH (ref 8–23)
CHLORIDE: 108 mmol/L (ref 98–111)
CO2: 20 mmol/L — AB (ref 22–32)
Calcium: 7.9 mg/dL — ABNORMAL LOW (ref 8.9–10.3)
Creatinine, Ser: 3.95 mg/dL — ABNORMAL HIGH (ref 0.61–1.24)
GFR calc non Af Amer: 13 mL/min — ABNORMAL LOW (ref >60)
GFR, EST AFRICAN AMERICAN: 15 mL/min — AB (ref >60)
GLUCOSE: 103 mg/dL — AB (ref 70–99)
Phosphorus: 4.8 mg/dL — ABNORMAL HIGH (ref 2.5–4.6)
Potassium: 5 mmol/L (ref 3.5–5.1)
SODIUM: 138 mmol/L (ref 135–145)

## 2018-01-18 LAB — BPAM RBC
BLOOD PRODUCT EXPIRATION DATE: 201908212359
BLOOD PRODUCT EXPIRATION DATE: 201908212359
Blood Product Expiration Date: 201908082359
Blood Product Expiration Date: 201908082359
Blood Product Expiration Date: 201908172359
Blood Product Expiration Date: 201908212359
Blood Product Expiration Date: 201908252359
Blood Product Expiration Date: 201908252359
ISSUE DATE / TIME: 201907261647
ISSUE DATE / TIME: 201907271538
ISSUE DATE / TIME: 201907272250
ISSUE DATE / TIME: 201907272250
ISSUE DATE / TIME: 201907291153
ISSUE DATE / TIME: 201907291512
ISSUE DATE / TIME: 201907261731
ISSUE DATE / TIME: 201907271311
UNIT TYPE AND RH: 9500
UNIT TYPE AND RH: 9500
UNIT TYPE AND RH: 9500
UNIT TYPE AND RH: 9500
Unit Type and Rh: 9500
Unit Type and Rh: 9500
Unit Type and Rh: 9500
Unit Type and Rh: 9500

## 2018-01-18 LAB — GLUCOSE, CAPILLARY
GLUCOSE-CAPILLARY: 71 mg/dL (ref 70–99)
GLUCOSE-CAPILLARY: 92 mg/dL (ref 70–99)
Glucose-Capillary: 88 mg/dL (ref 70–99)

## 2018-01-18 LAB — CBC WITH DIFFERENTIAL/PLATELET
BASOS ABS: 0 10*3/uL (ref 0.0–0.1)
BASOS PCT: 0 %
EOS ABS: 0.1 10*3/uL (ref 0.0–0.7)
EOS PCT: 1 %
HCT: 25.6 % — ABNORMAL LOW (ref 39.0–52.0)
Hemoglobin: 8.2 g/dL — ABNORMAL LOW (ref 13.0–17.0)
LYMPHS PCT: 8 %
Lymphs Abs: 0.9 10*3/uL (ref 0.7–4.0)
MCH: 29.6 pg (ref 26.0–34.0)
MCHC: 32 g/dL (ref 30.0–36.0)
MCV: 92.4 fL (ref 78.0–100.0)
MONOS PCT: 12 %
Monocytes Absolute: 1.4 10*3/uL — ABNORMAL HIGH (ref 0.1–1.0)
NEUTROS ABS: 9.2 10*3/uL — AB (ref 1.7–7.7)
NEUTROS PCT: 79 %
PLATELETS: 70 10*3/uL — AB (ref 150–400)
RBC: 2.77 MIL/uL — ABNORMAL LOW (ref 4.22–5.81)
RDW: 15.7 % — ABNORMAL HIGH (ref 11.5–15.5)
WBC MORPHOLOGY: INCREASED
WBC: 11.6 10*3/uL — ABNORMAL HIGH (ref 4.0–10.5)

## 2018-01-18 LAB — MAGNESIUM: MAGNESIUM: 2.3 mg/dL (ref 1.7–2.4)

## 2018-01-18 SURGERY — FIXATION, FRACTURE, INTERTROCHANTERIC, WITH INTRAMEDULLARY ROD
Anesthesia: General | Laterality: Right

## 2018-01-18 MED ORDER — PANTOPRAZOLE SODIUM 40 MG PO PACK: 40.0000 mg | PACK | Freq: Every day | ORAL | Status: DC

## 2018-01-18 MED ORDER — MORPHINE 100MG IN NS 100ML (1MG/ML) PREMIX INFUSION
2.0000 mg/h | INTRAVENOUS | Status: DC
  Administered 2018-01-18: 2 mg/h via INTRAVENOUS
  Filled 2018-01-18: qty 100

## 2018-01-18 MED ORDER — DEXTROSE 5 % IV SOLN
INTRAVENOUS | Status: DC
  Administered 2018-01-18: 09:00:00 via INTRAVENOUS

## 2018-01-18 MED ORDER — MORPHINE BOLUS VIA INFUSION
1.0000 mg | INTRAVENOUS | Status: DC | PRN
  Filled 2018-01-18: qty 1

## 2018-01-19 ENCOUNTER — Encounter (HOSPITAL_COMMUNITY): Payer: Self-pay
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
## 2018-01-20 NOTE — Progress Notes (Signed)
[facility]opaedic [facility] aware. Appreciate all staff efforts and patient's family.  Myrene GalasMichael Naasir Carreira, MD [facility], [facility] [phone]

## 2018-01-20 NOTE — Progress Notes (Signed)
CRRT stopped at 0828 per verbal order from Dr. Lindie SpruceWyatt.  All questions answered for family. Will continue to monitor. Dicie BeamFrazier, Enis Leatherwood RN BSN.

## 2018-01-20 NOTE — Progress Notes (Signed)
   02-13-18 1100  Clinical Encounter Type  Visited With Patient  Visit Type Follow-up;Spiritual support  Referral From Nurse  Consult/Referral To Chaplain  Spiritual Encounters  Spiritual Needs Emotional;Prayer;Grief support  Stress Factors  Family Stress Factors Major life changes;Loss;Health changes  Chaplain was able to visit with the PT family prior to extubation.  There were 3 family members present in the room all congregated in the corner.  The PT will be extubated after the significant other of the PT arrives.  It is not clear when the PT will pass but family would like to be able to have some private time in the conference room or quiet room with family after he passes.  There is much anticipatory grief in the room among the family, yet there was great concern for another family they had met on the unit.
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
## 2018-01-20 NOTE — Progress Notes (Addendum)
Pt still intubated and unresponsive. Meet family at bedside. All the members of the family have been explained about the diagnosis, current plan and prognosis. They are in unanimous decision that they want comfort care for the patient given the nature of the condition is serious and critical with poor prognosis. As per patient and family wishes we will initiate comfort care once other family member arrives.  Neurology will sign off. Please call with questions. Thanks for the consult.  Marvel PlanJindong Daphene Chisholm, MD PhD Stroke Neurology 01/10/2018 11:03 AM

## 2018-01-20 NOTE — Progress Notes (Signed)
Patient ID: Noah Adams, male   DOB: [birth year], 84 y.o.   MRN: [id number] No significant change neurologically continues with unresponsive minimal withdrawal upper extremities.  Heavily sedated at this point.  Still requires pressor support with levo fed.
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
## 2018-01-20 NOTE — Progress Notes (Signed)
50 cc of Fentanyl wasted in sink with Technical sales engineerMeghan RN. Briant CedarFraizer, Yury Schaus RN BSN.

## 2018-01-20 NOTE — Progress Notes (Signed)
I had a long discussion with the family and they want to withdraw support based on the poor prognosis that has been presented.  They fully understand the current status, but they also know that he would not want to live like this.  There is still one family member that will not get here until about 12N.  Once that familyb member has gotten here we will withdraw support.  We will  Make him a DNR patient now.  Marta LamasJames O. Gae BonWyatt, III, MD, FACS [phone] Trauma Surgeon

## 2018-01-20 NOTE — Procedures (Signed)
Extubation Procedure Note  Patient Details:   Name: Noah Adams DOB: 09-06-1933 MRN: [id number]   Airway Documentation:    Vent end date: 12/28/2017 Vent end time: 1544   Evaluation  Pt extubated to RA per Withdrawal of life protocol.    Noah Adams, Noah Adams 01/06/2018, 3:45 PM
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
## 2018-01-20 NOTE — Progress Notes (Signed)
Patient deceased at 1550, no heart or lung sounds via ascultation. Verification with Amy RN. 50cc of morphine wasted in sink with Amy. Family at bedside. Will continue to monitor. Dicie BeamFrazier, Jabier Deese RN BSN.

## 2018-01-20 DEATH — deceased

## 2018-01-25 DIAGNOSIS — Z978 Presence of other specified devices: Secondary | ICD-10-CM

## 2018-01-25 DIAGNOSIS — Z515 Encounter for palliative care: Secondary | ICD-10-CM
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
